# Patient Record
Sex: Male | Born: 1945 | Race: White | Hispanic: No | Marital: Married | Smoking: Former smoker
Health system: Southern US, Community
[De-identification: ages and names within clinical notes are randomized; demographics above are authoritative.]

## PROBLEM LIST (undated history)

## (undated) DIAGNOSIS — E785 Hyperlipidemia, unspecified: Secondary | ICD-10-CM

## (undated) DIAGNOSIS — G4700 Insomnia, unspecified: Secondary | ICD-10-CM

## (undated) DIAGNOSIS — F329 Major depressive disorder, single episode, unspecified: Secondary | ICD-10-CM

## (undated) DIAGNOSIS — E291 Testicular hypofunction: Secondary | ICD-10-CM

## (undated) DIAGNOSIS — M25552 Pain in left hip: Secondary | ICD-10-CM

## (undated) DIAGNOSIS — I10 Essential (primary) hypertension: Secondary | ICD-10-CM

## (undated) DIAGNOSIS — N400 Enlarged prostate without lower urinary tract symptoms: Secondary | ICD-10-CM

## (undated) DIAGNOSIS — I2510 Atherosclerotic heart disease of native coronary artery without angina pectoris: Secondary | ICD-10-CM

## (undated) DIAGNOSIS — R972 Elevated prostate specific antigen [PSA]: Secondary | ICD-10-CM

## (undated) DIAGNOSIS — F32A Depression, unspecified: Secondary | ICD-10-CM

## (undated) DIAGNOSIS — M25551 Pain in right hip: Secondary | ICD-10-CM

## (undated) DIAGNOSIS — K219 Gastro-esophageal reflux disease without esophagitis: Secondary | ICD-10-CM

## (undated) DIAGNOSIS — T7840XA Allergy, unspecified, initial encounter: Secondary | ICD-10-CM

## (undated) HISTORY — DX: Depression, unspecified: F32.A

## (undated) HISTORY — DX: Pain in left hip: M25.552

## (undated) HISTORY — DX: Atherosclerotic heart disease of native coronary artery without angina pectoris: I25.10

## (undated) HISTORY — DX: Insomnia, unspecified: G47.00

## (undated) HISTORY — DX: Allergy, unspecified, initial encounter: T78.40XA

## (undated) HISTORY — PX: HERNIA REPAIR: SHX51

## (undated) HISTORY — DX: Benign prostatic hyperplasia without lower urinary tract symptoms: N40.0

## (undated) HISTORY — DX: Hyperlipidemia, unspecified: E78.5

## (undated) HISTORY — DX: Elevated prostate specific antigen (PSA): R97.20

## (undated) HISTORY — DX: Essential (primary) hypertension: I10

## (undated) HISTORY — PX: CHOLECYSTECTOMY: SHX55

## (undated) HISTORY — DX: Pain in right hip: M25.551

## (undated) HISTORY — DX: Major depressive disorder, single episode, unspecified: F32.9

## (undated) HISTORY — DX: Testicular hypofunction: E29.1

## (undated) HISTORY — DX: Gastro-esophageal reflux disease without esophagitis: K21.9

---

## 2005-03-08 ENCOUNTER — Ambulatory Visit: Payer: Self-pay | Admitting: Cardiology

## 2005-05-10 ENCOUNTER — Ambulatory Visit: Payer: Self-pay | Admitting: Family Medicine

## 2005-05-17 ENCOUNTER — Ambulatory Visit: Payer: Self-pay | Admitting: Family Medicine

## 2005-09-05 ENCOUNTER — Ambulatory Visit: Payer: Self-pay | Admitting: Family Medicine

## 2006-03-19 ENCOUNTER — Ambulatory Visit: Payer: Self-pay | Admitting: Family Medicine

## 2006-07-01 ENCOUNTER — Ambulatory Visit: Payer: Self-pay | Admitting: Family Medicine

## 2006-10-10 ENCOUNTER — Encounter: Payer: Self-pay | Admitting: Family Medicine

## 2007-03-04 ENCOUNTER — Ambulatory Visit: Payer: Self-pay | Admitting: Family Medicine

## 2007-06-16 DIAGNOSIS — I10 Essential (primary) hypertension: Secondary | ICD-10-CM | POA: Insufficient documentation

## 2007-06-16 DIAGNOSIS — K219 Gastro-esophageal reflux disease without esophagitis: Secondary | ICD-10-CM | POA: Insufficient documentation

## 2007-06-16 DIAGNOSIS — E785 Hyperlipidemia, unspecified: Secondary | ICD-10-CM | POA: Insufficient documentation

## 2007-07-08 ENCOUNTER — Ambulatory Visit: Payer: Self-pay | Admitting: Family Medicine

## 2007-07-08 LAB — CONVERTED CEMR LAB
Nitrite: NEGATIVE
Urobilinogen, UA: NEGATIVE
WBC Urine, dipstick: NEGATIVE

## 2007-07-11 LAB — CONVERTED CEMR LAB
AST: 23 units/L (ref 0–37)
Alkaline Phosphatase: 52 units/L (ref 39–117)
BUN: 14 mg/dL (ref 6–23)
Basophils Relative: 0.2 % (ref 0.0–1.0)
CO2: 30 meq/L (ref 19–32)
Chloride: 108 meq/L (ref 96–112)
Creatinine, Ser: 0.8 mg/dL (ref 0.4–1.5)
HCT: 46.2 % (ref 39.0–52.0)
Hemoglobin: 15.7 g/dL (ref 13.0–17.0)
LDL Cholesterol: 106 mg/dL — ABNORMAL HIGH (ref 0–99)
Monocytes Absolute: 0.9 10*3/uL — ABNORMAL HIGH (ref 0.2–0.7)
Monocytes Relative: 10 % (ref 3.0–11.0)
Neutrophils Relative %: 54.4 % (ref 43.0–77.0)
Potassium: 4.6 meq/L (ref 3.5–5.1)
RBC: 5.08 M/uL (ref 4.22–5.81)
RDW: 12.9 % (ref 11.5–14.6)
TSH: 0.68 microintl units/mL (ref 0.35–5.50)
Total Bilirubin: 0.6 mg/dL (ref 0.3–1.2)
Total Protein: 6.9 g/dL (ref 6.0–8.3)
VLDL: 16 mg/dL (ref 0–40)

## 2007-07-15 ENCOUNTER — Ambulatory Visit: Payer: Self-pay | Admitting: Family Medicine

## 2007-07-15 DIAGNOSIS — J309 Allergic rhinitis, unspecified: Secondary | ICD-10-CM | POA: Insufficient documentation

## 2007-07-15 DIAGNOSIS — I2510 Atherosclerotic heart disease of native coronary artery without angina pectoris: Secondary | ICD-10-CM | POA: Insufficient documentation

## 2007-07-15 DIAGNOSIS — M069 Rheumatoid arthritis, unspecified: Secondary | ICD-10-CM | POA: Insufficient documentation

## 2007-07-15 DIAGNOSIS — Z8719 Personal history of other diseases of the digestive system: Secondary | ICD-10-CM | POA: Insufficient documentation

## 2007-07-15 DIAGNOSIS — Z87442 Personal history of urinary calculi: Secondary | ICD-10-CM | POA: Insufficient documentation

## 2008-03-02 ENCOUNTER — Ambulatory Visit: Payer: Self-pay | Admitting: Family Medicine

## 2008-03-10 ENCOUNTER — Encounter: Payer: Self-pay | Admitting: Family Medicine

## 2008-03-12 ENCOUNTER — Telehealth: Payer: Self-pay | Admitting: Family Medicine

## 2008-03-16 ENCOUNTER — Telehealth: Payer: Self-pay | Admitting: Family Medicine

## 2008-04-02 ENCOUNTER — Encounter: Payer: Self-pay | Admitting: Family Medicine

## 2008-07-07 ENCOUNTER — Ambulatory Visit: Payer: Self-pay | Admitting: Family Medicine

## 2008-07-07 LAB — CONVERTED CEMR LAB: Anti Nuclear Antibody(ANA): NEGATIVE

## 2008-07-09 LAB — CONVERTED CEMR LAB
Basophils Absolute: 0.1 10*3/uL (ref 0.0–0.1)
Eosinophils Absolute: 0.1 10*3/uL (ref 0.0–0.7)
HCT: 43.8 % (ref 39.0–52.0)
Hemoglobin: 15.6 g/dL (ref 13.0–17.0)
MCHC: 35.6 g/dL (ref 30.0–36.0)
MCV: 91.5 fL (ref 78.0–100.0)
Monocytes Absolute: 1.2 10*3/uL — ABNORMAL HIGH (ref 0.1–1.0)
Monocytes Relative: 7.8 % (ref 3.0–12.0)
Neutro Abs: 12.6 10*3/uL — ABNORMAL HIGH (ref 1.4–7.7)
Platelets: 272 10*3/uL (ref 150–400)
RDW: 12.3 % (ref 11.5–14.6)
Rhuematoid fact SerPl-aCnc: <20 intl units/mL — ABNORMAL LOW (ref 0.0–20.0)

## 2008-07-14 ENCOUNTER — Telehealth: Payer: Self-pay | Admitting: Family Medicine

## 2008-07-15 ENCOUNTER — Ambulatory Visit: Payer: Self-pay | Admitting: Family Medicine

## 2008-07-15 DIAGNOSIS — K5289 Other specified noninfective gastroenteritis and colitis: Secondary | ICD-10-CM | POA: Insufficient documentation

## 2008-08-05 ENCOUNTER — Ambulatory Visit: Payer: Self-pay | Admitting: Family Medicine

## 2008-08-05 LAB — CONVERTED CEMR LAB
Nitrite: NEGATIVE
Specific Gravity, Urine: 1.025
Urobilinogen, UA: 0.2

## 2008-08-09 LAB — CONVERTED CEMR LAB
ALT: 32 units/L (ref 0–53)
Albumin: 3.9 g/dL (ref 3.5–5.2)
Alkaline Phosphatase: 53 units/L (ref 39–117)
BUN: 15 mg/dL (ref 6–23)
Bilirubin, Direct: 0.1 mg/dL (ref 0.0–0.3)
CO2: 29 meq/L (ref 19–32)
Eosinophils Relative: 8 % — ABNORMAL HIGH (ref 0.0–5.0)
Glucose, Bld: 114 mg/dL — ABNORMAL HIGH (ref 70–99)
HCT: 44.5 % (ref 39.0–52.0)
Hemoglobin: 15.2 g/dL (ref 13.0–17.0)
LDL Cholesterol: 130 mg/dL — ABNORMAL HIGH (ref 0–99)
Lymphocytes Relative: 33.6 % (ref 12.0–46.0)
Monocytes Absolute: 0.8 10*3/uL (ref 0.1–1.0)
Monocytes Relative: 12 % (ref 3.0–12.0)
Neutro Abs: 2.9 10*3/uL (ref 1.4–7.7)
Platelets: 352 10*3/uL (ref 150–400)
Potassium: 4.5 meq/L (ref 3.5–5.1)
RDW: 13.1 % (ref 11.5–14.6)
Sodium: 143 meq/L (ref 135–145)
Total CHOL/HDL Ratio: 4.5
Total Protein: 6.8 g/dL (ref 6.0–8.3)
Triglycerides: 69 mg/dL (ref 0–149)
WBC: 6.5 10*3/uL (ref 4.5–10.5)

## 2008-08-12 ENCOUNTER — Ambulatory Visit: Payer: Self-pay | Admitting: Family Medicine

## 2008-08-12 DIAGNOSIS — N138 Other obstructive and reflux uropathy: Secondary | ICD-10-CM | POA: Insufficient documentation

## 2008-08-12 DIAGNOSIS — N401 Enlarged prostate with lower urinary tract symptoms: Secondary | ICD-10-CM

## 2008-08-31 ENCOUNTER — Ambulatory Visit: Payer: Self-pay | Admitting: Gastroenterology

## 2008-09-14 ENCOUNTER — Ambulatory Visit: Payer: Self-pay | Admitting: Gastroenterology

## 2008-09-15 ENCOUNTER — Telehealth: Payer: Self-pay | Admitting: Family Medicine

## 2008-11-05 HISTORY — PX: TRANSURETHRAL RESECTION OF PROSTATE: SHX73

## 2008-12-07 ENCOUNTER — Encounter: Payer: Self-pay | Admitting: Family Medicine

## 2009-01-18 ENCOUNTER — Ambulatory Visit: Payer: Self-pay | Admitting: Family Medicine

## 2009-01-20 ENCOUNTER — Telehealth: Payer: Self-pay | Admitting: Family Medicine

## 2009-02-08 ENCOUNTER — Ambulatory Visit: Payer: Self-pay | Admitting: Family Medicine

## 2009-03-15 ENCOUNTER — Telehealth: Payer: Self-pay | Admitting: Family Medicine

## 2009-03-30 ENCOUNTER — Telehealth (INDEPENDENT_AMBULATORY_CARE_PROVIDER_SITE_OTHER): Payer: Self-pay | Admitting: *Deleted

## 2009-03-30 ENCOUNTER — Ambulatory Visit: Payer: Self-pay | Admitting: Family Medicine

## 2009-03-30 DIAGNOSIS — L0292 Furuncle, unspecified: Secondary | ICD-10-CM | POA: Insufficient documentation

## 2009-03-30 DIAGNOSIS — L0293 Carbuncle, unspecified: Secondary | ICD-10-CM

## 2009-03-31 ENCOUNTER — Encounter: Payer: Self-pay | Admitting: Family Medicine

## 2009-05-23 ENCOUNTER — Ambulatory Visit: Payer: Self-pay | Admitting: Family Medicine

## 2009-05-23 DIAGNOSIS — R531 Weakness: Secondary | ICD-10-CM | POA: Insufficient documentation

## 2009-05-23 DIAGNOSIS — R5383 Other fatigue: Secondary | ICD-10-CM

## 2009-05-23 DIAGNOSIS — R5381 Other malaise: Secondary | ICD-10-CM

## 2009-05-31 ENCOUNTER — Encounter: Payer: Self-pay | Admitting: Family Medicine

## 2009-05-31 DIAGNOSIS — D72829 Elevated white blood cell count, unspecified: Secondary | ICD-10-CM | POA: Insufficient documentation

## 2009-06-01 ENCOUNTER — Ambulatory Visit: Payer: Self-pay | Admitting: Family Medicine

## 2009-06-01 LAB — CONVERTED CEMR LAB
Albumin: 4.7 g/dL (ref 3.5–5.2)
Alkaline Phosphatase: 60 units/L (ref 39–117)
BUN: 18 mg/dL (ref 6–23)
Basophils Absolute: 0.1 10*3/uL (ref 0.0–0.1)
Bilirubin, Direct: 0.1 mg/dL (ref 0.0–0.3)
CO2: 26 meq/L (ref 19–32)
Calcium: 11 mg/dL — ABNORMAL HIGH (ref 8.4–10.5)
Creatinine, Ser: 1.1 mg/dL (ref 0.4–1.5)
Eosinophils Absolute: 0.5 10*3/uL (ref 0.0–0.7)
GFR calc non Af Amer: 71.67 mL/min (ref >60)
Glucose, Bld: 101 mg/dL — ABNORMAL HIGH (ref 70–99)
Lymphocytes Relative: 24.5 % (ref 12.0–46.0)
MCHC: 34.2 g/dL (ref 30.0–36.0)
Monocytes Relative: 9.6 % (ref 3.0–12.0)
Neutro Abs: 8.1 10*3/uL — ABNORMAL HIGH (ref 1.4–7.7)
Neutrophils Relative %: 61.5 % (ref 43.0–77.0)
Platelets: 288 10*3/uL (ref 150.0–400.0)
RDW: 13.1 % (ref 11.5–14.6)
Total Bilirubin: 0.9 mg/dL (ref 0.3–1.2)

## 2009-06-02 ENCOUNTER — Ambulatory Visit: Payer: Self-pay | Admitting: Cardiovascular Disease

## 2009-06-02 ENCOUNTER — Encounter: Payer: Self-pay | Admitting: Family Medicine

## 2009-06-24 ENCOUNTER — Ambulatory Visit: Payer: Self-pay | Admitting: Family Medicine

## 2009-06-24 DIAGNOSIS — M25559 Pain in unspecified hip: Secondary | ICD-10-CM | POA: Insufficient documentation

## 2009-07-06 ENCOUNTER — Ambulatory Visit: Payer: Self-pay | Admitting: Family Medicine

## 2009-07-12 ENCOUNTER — Telehealth: Payer: Self-pay | Admitting: Family Medicine

## 2009-07-18 ENCOUNTER — Encounter: Payer: Self-pay | Admitting: Family Medicine

## 2009-08-09 ENCOUNTER — Ambulatory Visit: Payer: Self-pay | Admitting: Family Medicine

## 2009-08-09 LAB — CONVERTED CEMR LAB
Bilirubin Urine: NEGATIVE
Glucose, Urine, Semiquant: NEGATIVE
Ketones, urine, test strip: NEGATIVE
pH: 5.5

## 2009-08-10 LAB — CONVERTED CEMR LAB
BUN: 13 mg/dL (ref 6–23)
Basophils Relative: 1 % (ref 0.0–3.0)
Bilirubin, Direct: 0.1 mg/dL (ref 0.0–0.3)
CO2: 28 meq/L (ref 19–32)
Chloride: 106 meq/L (ref 96–112)
Cholesterol: 121 mg/dL (ref 0–200)
Creatinine, Ser: 0.7 mg/dL (ref 0.4–1.5)
Eosinophils Absolute: 0.5 10*3/uL (ref 0.0–0.7)
HCT: 46.2 % (ref 39.0–52.0)
Lymphs Abs: 2.3 10*3/uL (ref 0.7–4.0)
MCHC: 34.1 g/dL (ref 30.0–36.0)
MCV: 94.6 fL (ref 78.0–100.0)
Monocytes Absolute: 0.9 10*3/uL (ref 0.1–1.0)
Neutrophils Relative %: 53.7 % (ref 43.0–77.0)
PSA: 4.22 ng/mL — ABNORMAL HIGH (ref 0.10–4.00)
Platelets: 272 10*3/uL (ref 150.0–400.0)
TSH: 0.68 microintl units/mL (ref 0.35–5.50)
Total Bilirubin: 0.6 mg/dL (ref 0.3–1.2)
Total Protein: 6.7 g/dL (ref 6.0–8.3)
Triglycerides: 97 mg/dL (ref 0.0–149.0)

## 2009-08-16 ENCOUNTER — Ambulatory Visit: Payer: Self-pay | Admitting: Family Medicine

## 2009-09-14 ENCOUNTER — Encounter: Payer: Self-pay | Admitting: Family Medicine

## 2009-09-23 ENCOUNTER — Encounter: Payer: Self-pay | Admitting: Family Medicine

## 2009-10-10 ENCOUNTER — Ambulatory Visit: Payer: Self-pay | Admitting: Family Medicine

## 2009-10-12 ENCOUNTER — Encounter: Payer: Self-pay | Admitting: Family Medicine

## 2009-11-21 ENCOUNTER — Telehealth: Payer: Self-pay | Admitting: Family Medicine

## 2009-11-22 ENCOUNTER — Ambulatory Visit: Payer: Self-pay | Admitting: Family Medicine

## 2009-11-22 DIAGNOSIS — M25569 Pain in unspecified knee: Secondary | ICD-10-CM | POA: Insufficient documentation

## 2010-01-19 ENCOUNTER — Ambulatory Visit: Payer: Self-pay | Admitting: Family Medicine

## 2010-01-19 DIAGNOSIS — F411 Generalized anxiety disorder: Secondary | ICD-10-CM

## 2010-01-20 LAB — CONVERTED CEMR LAB
Albumin: 4.2 g/dL (ref 3.5–5.2)
BUN: 13 mg/dL (ref 6–23)
Basophils Absolute: 0.1 10*3/uL (ref 0.0–0.1)
CO2: 30 meq/L (ref 19–32)
Cholesterol: 138 mg/dL (ref 0–200)
Eosinophils Absolute: 0.5 10*3/uL (ref 0.0–0.7)
GFR calc non Af Amer: 103.28 mL/min (ref >60)
Glucose, Bld: 112 mg/dL — ABNORMAL HIGH (ref 70–99)
HCT: 48.7 % (ref 39.0–52.0)
Hemoglobin: 15.9 g/dL (ref 13.0–17.0)
Hgb A1c MFr Bld: 6.6 % — ABNORMAL HIGH (ref 4.6–6.5)
Lymphs Abs: 2.5 10*3/uL (ref 0.7–4.0)
MCHC: 32.7 g/dL (ref 30.0–36.0)
MCV: 94.4 fL (ref 78.0–100.0)
Monocytes Absolute: 0.8 10*3/uL (ref 0.1–1.0)
Neutro Abs: 3.9 10*3/uL (ref 1.4–7.7)
Platelets: 278 10*3/uL (ref 150.0–400.0)
Potassium: 4.4 meq/L (ref 3.5–5.1)
RDW: 13 % (ref 11.5–14.6)
Sodium: 141 meq/L (ref 135–145)
TSH: 0.61 microintl units/mL (ref 0.35–5.50)
Total Bilirubin: 0.8 mg/dL (ref 0.3–1.2)
VLDL: 18.8 mg/dL (ref 0.0–40.0)

## 2010-02-17 ENCOUNTER — Telehealth: Payer: Self-pay | Admitting: Family Medicine

## 2010-02-20 ENCOUNTER — Ambulatory Visit: Payer: Self-pay | Admitting: Family Medicine

## 2010-02-27 ENCOUNTER — Telehealth: Payer: Self-pay | Admitting: Family Medicine

## 2010-03-21 ENCOUNTER — Ambulatory Visit: Payer: Self-pay | Admitting: Family Medicine

## 2010-05-18 IMAGING — CT CT CHEST W/ CM
2 of 5 series · 14 of 36 positions shown, 17 images · IV contrast (agent unspecified)
Comparison: None

CT CHEST

CLINICAL DATA: Fatigue.  Night sweats.  Weakness.  Diarrhea.
Tobacco use.

CT CHEST, ABDOMEN AND PELVIS WITH CONTRAST
TECHNIQUE: Multidetector CT imaging of the chest, abdomen and
pelvis was performed following the standard protocol during bolus
administration of intravenous contrast.
Contrast: 150 ml [id number]-JHH

[Series 2: cap with · axial · 0.98mm/px · z∈[-614,-78]mm · 11 of 125 slices shown, 14 images]
[im 9/125  mediastinal]
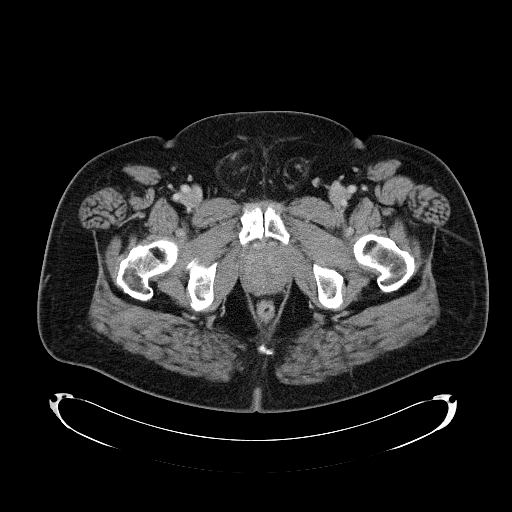
[im 9/125  lung]
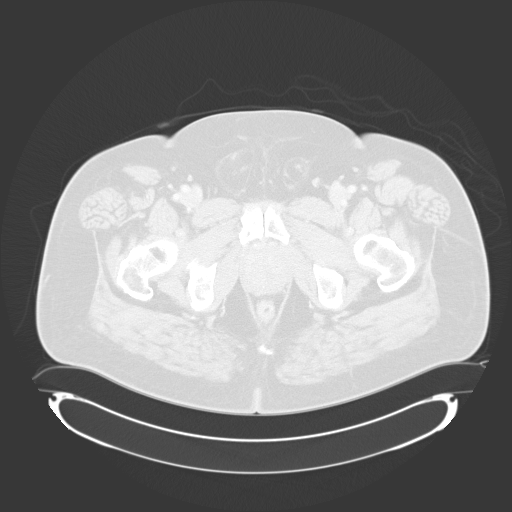
[im 18/125  lung]
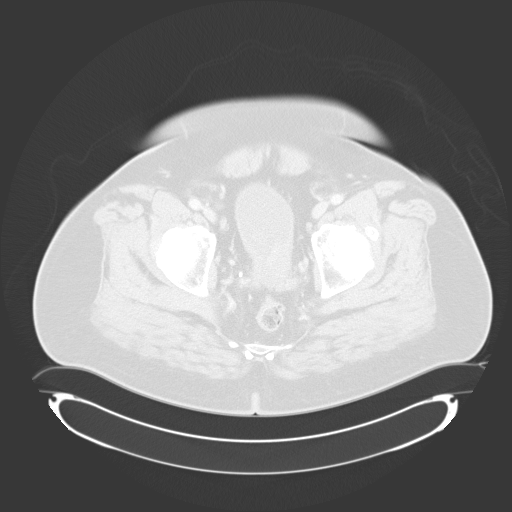
[im 27/125  lung]
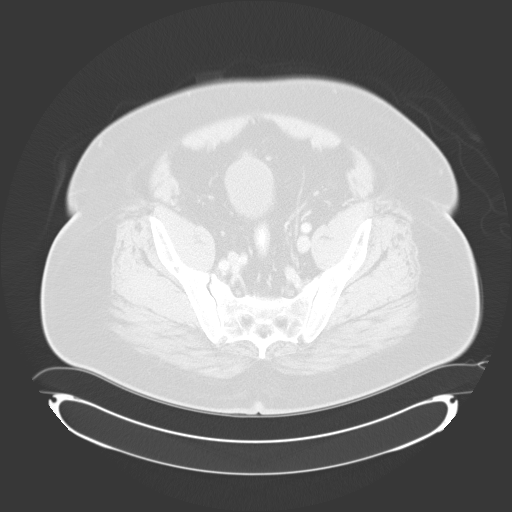
[im 45/125  lung]
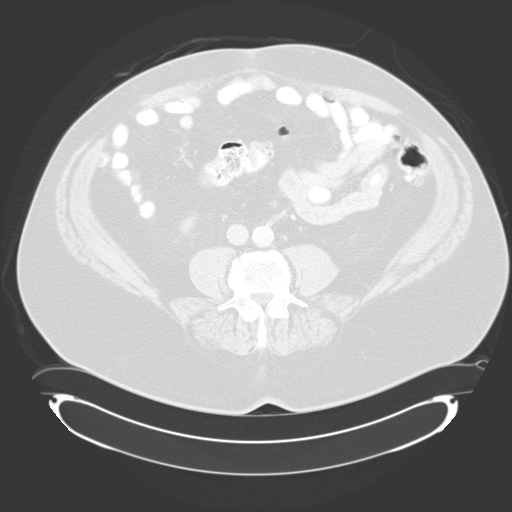
[im 54/125  mediastinal]
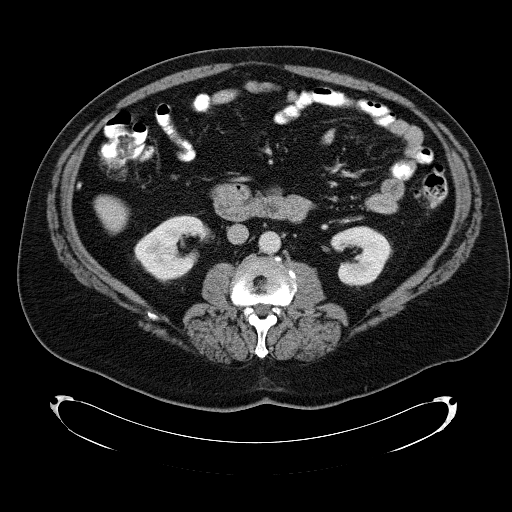
[im 54/125  lung]
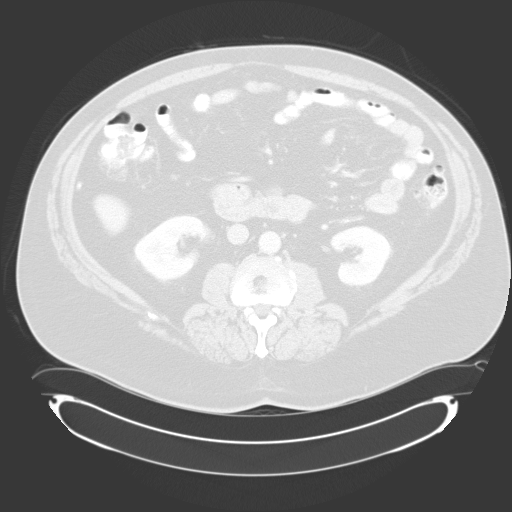
[im 63/125  lung]
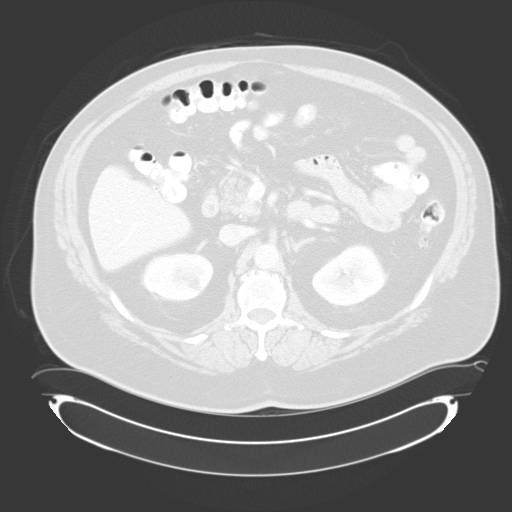
[im 71/125  lung]
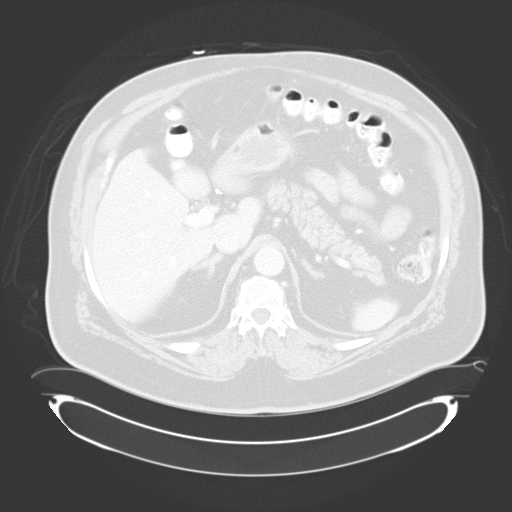
[im 80/125  lung]
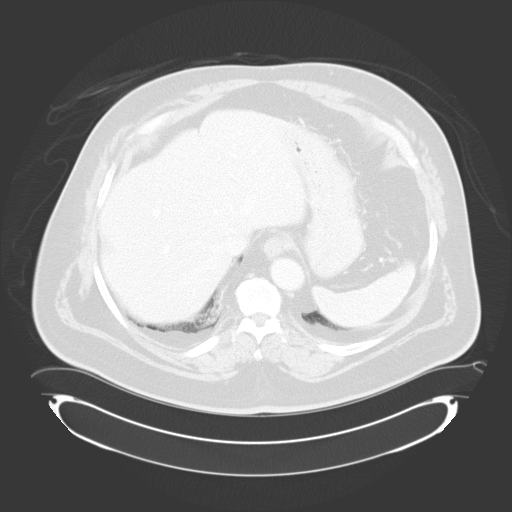
[im 98/125  mediastinal]
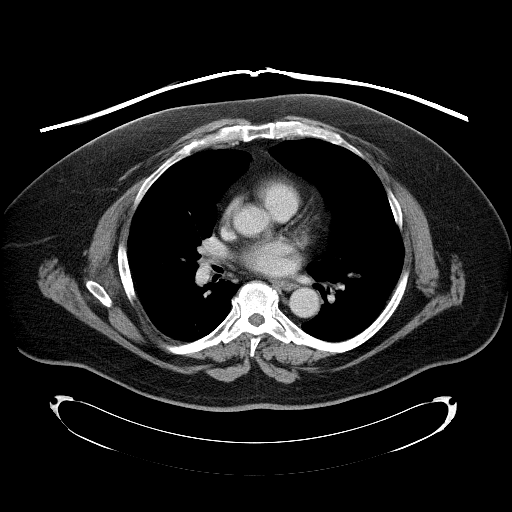
[im 98/125  lung]
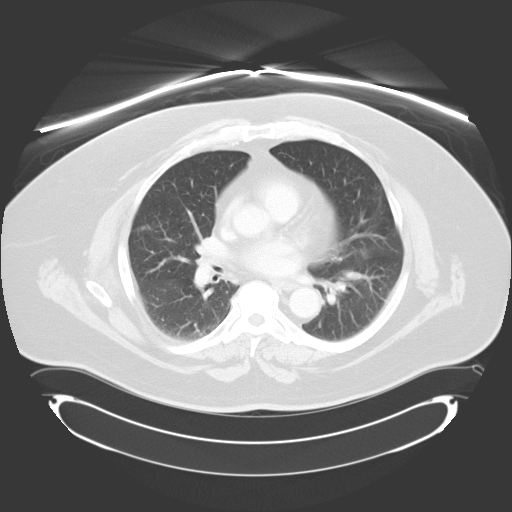
[im 107/125  lung]
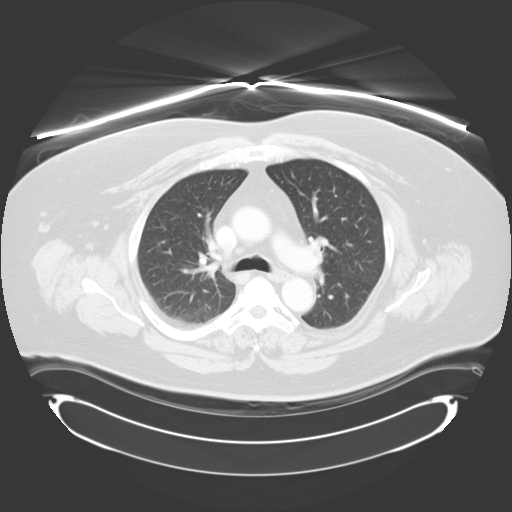
[im 116/125  lung]
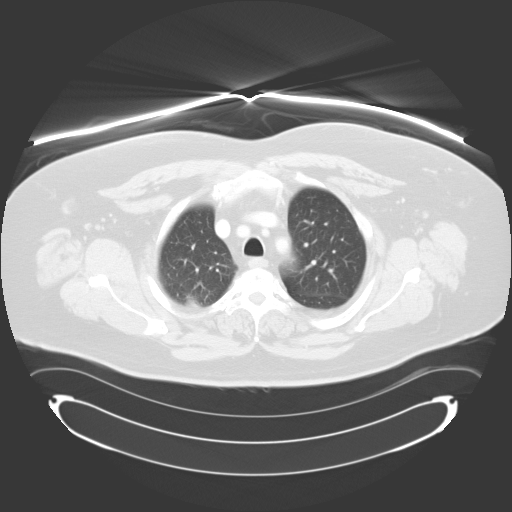

[Series 602: <mpr range> · coronal · 1.24mm/px · 3 of 170 slices shown]
[im 34/170  lung]
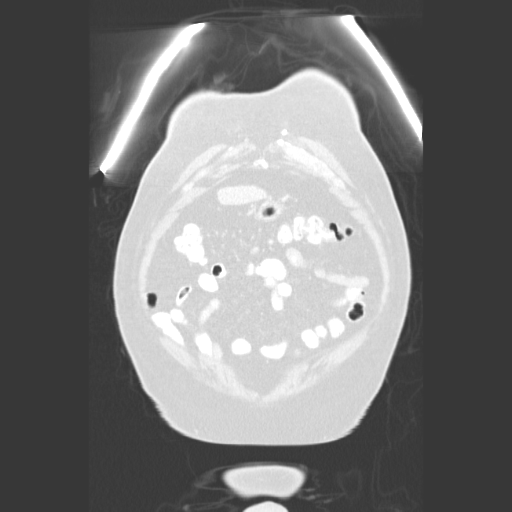
[im 68/170  lung]
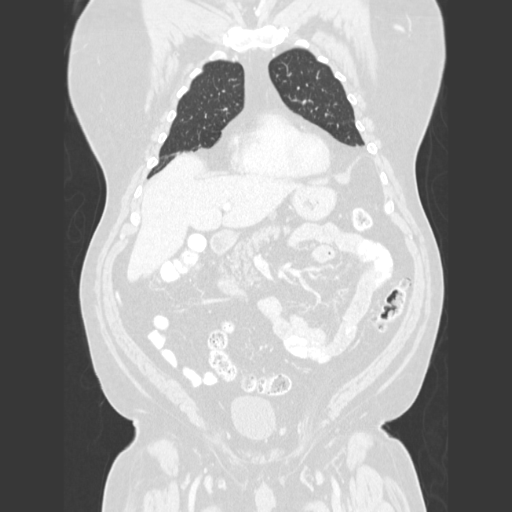
[im 102/170  lung]
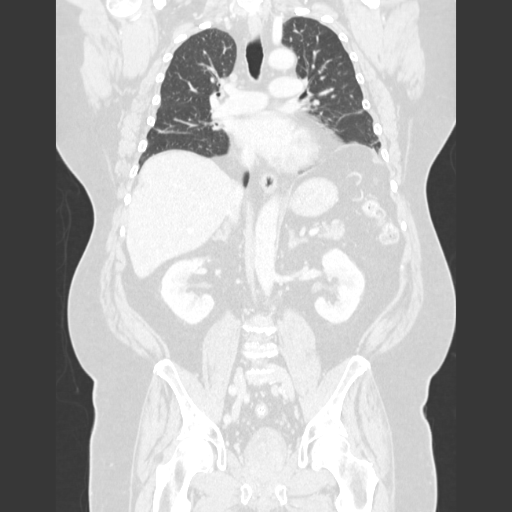

[14 of 36 positions shown; findings below may reference images not displayed]

FINDINGS: Mild prominence of supraclavicular adipose tissue on the
right could reflect a small lipoma in this vicinity, but is likely
incidental.

The thoracic inlet appears otherwise unremarkable.  Several small
mediastinal lymph nodes are present but are not pathologically
enlarged by size criteria.  No pathologic hilar adenopathy is
noted.

Heart size is within normal limits.  There is some prominence of
the epicardial adipose tissues and also of pleural adipose tissue.

There is some minimal subsegmental atelectasis in the right middle
lobe.  There is also subsegmental atelectasis or scarring in the
left lower lobe, for example on image 35 of series #4.  A 2 mm
nodule along the left major fissure on image 32 of series #4 likely
represents a subpleural lymph node.
IMPRESSION: 1.  No specific abnormalities are identified to account the
patient's symptoms.

CT ABDOMEN
FINDINGS: An 8 x 4 mm hypodense lesion in the segment to five of
the liver on image 61 of series #2 is technically nonspecific
although statistically likely to be benign.

Several hypodense lesions in the right kidney include a 1.3 x
cm upper pole hypodensity, a 6 mm in diameter mid kidney
hypodensity, and a separate 3 mm in diameter mid kidney
hypodensity.  These are highly likely to represent cysts.

The spleen, adrenal glands, and pancreas appear normal.  The
gallbladder surgically absent.

The appendix appears normal.  No dilated bowel or abnormal bowel
wall thickening is identified.

There are numerous colonic diverticula in the descending colon.

No pathologic retroperitoneal or porta hepatis adenopathy is
identified.

There is loss of intervertebral disc height as well as multilevel
vacuum disc phenomenon and intervertebral spurring.
IMPRESSION: 1.  Small hypodense right renal and right hepatic lobe lesions are
technically too small to characterize although statistically likely
to be benign.
2.  Descending colon diverticulosis.
3.  Lumbar spondylosis and degenerative disc disease.

CT PELVIS
FINDINGS: There is bridging spurring of the left sacroiliac joint.

Sigmoid diverticulosis noted without active diverticulitis
identified.

The prostate gland appears somewhat irregular and elongated
vertically, significantly indenting the bladder base.  Although
this could be due to benign hypertrophy, there is some lobularity
of the upper margin of the prostate, and also there are several
unusual lymph nodes anterior to the left side of the urinary
bladder as shown on images 102-107 of series #2.  I recommend
careful correlation with PSA level, prostate history, and physical
exam findings.

No free pelvic fluid noted.  There is a chronically fragmented
osteophyte from the left acetabulum
IMPRESSION: 1.  Prominent irregular prostate gland, with several unusual
perivesical lymph nodes.  Recommend correlation with prostate
physical exam, PSA level, urine analysis, and prostate history.
2.  Sigmoid diverticulosis without active diverticulitis
identified.

## 2010-08-18 ENCOUNTER — Encounter: Payer: Self-pay | Admitting: Family Medicine

## 2010-08-18 ENCOUNTER — Ambulatory Visit: Payer: Self-pay | Admitting: Family Medicine

## 2010-08-18 DIAGNOSIS — E291 Testicular hypofunction: Secondary | ICD-10-CM | POA: Insufficient documentation

## 2010-08-21 LAB — CONVERTED CEMR LAB
Albumin: 4.1 g/dL (ref 3.5–5.2)
Basophils Absolute: 0.1 10*3/uL (ref 0.0–0.1)
Basophils Relative: 0.6 % (ref 0.0–3.0)
CO2: 28 meq/L (ref 19–32)
Calcium: 10.5 mg/dL (ref 8.4–10.5)
Chloride: 108 meq/L (ref 96–112)
Creatinine, Ser: 0.8 mg/dL (ref 0.4–1.5)
Creatinine,U: 193.8 mg/dL
Eosinophils Absolute: 0.4 10*3/uL (ref 0.0–0.7)
Glucose, Bld: 114 mg/dL — ABNORMAL HIGH (ref 70–99)
HDL: 38.3 mg/dL — ABNORMAL LOW (ref >39.00)
Hemoglobin: 15.3 g/dL (ref 13.0–17.0)
Lymphocytes Relative: 25.4 % (ref 12.0–46.0)
MCHC: 33.5 g/dL (ref 30.0–36.0)
MCV: 94.3 fL (ref 78.0–100.0)
Microalb Creat Ratio: 7 mg/g (ref 0.0–30.0)
Microalb, Ur: 13.5 mg/dL — ABNORMAL HIGH (ref 0.0–1.9)
Monocytes Absolute: 0.9 10*3/uL (ref 0.1–1.0)
Neutro Abs: 5.5 10*3/uL (ref 1.4–7.7)
Neutrophils Relative %: 60.5 % (ref 43.0–77.0)
RBC: 4.85 M/uL (ref 4.22–5.81)
RDW: 13.5 % (ref 11.5–14.6)
Total CHOL/HDL Ratio: 4
Total Protein: 6.8 g/dL (ref 6.0–8.3)
Triglycerides: 71 mg/dL (ref 0.0–149.0)

## 2010-08-23 ENCOUNTER — Telehealth: Payer: Self-pay | Admitting: Family Medicine

## 2010-09-15 ENCOUNTER — Telehealth: Payer: Self-pay | Admitting: Family Medicine

## 2010-10-03 ENCOUNTER — Ambulatory Visit: Payer: Self-pay | Admitting: Licensed Clinical Social Worker

## 2010-10-11 ENCOUNTER — Ambulatory Visit: Payer: Self-pay | Admitting: Licensed Clinical Social Worker

## 2010-10-18 ENCOUNTER — Ambulatory Visit: Payer: Self-pay | Admitting: Licensed Clinical Social Worker

## 2010-10-25 ENCOUNTER — Ambulatory Visit: Payer: Self-pay | Admitting: Licensed Clinical Social Worker

## 2010-11-01 ENCOUNTER — Ambulatory Visit: Payer: Self-pay | Admitting: Licensed Clinical Social Worker

## 2010-11-09 ENCOUNTER — Ambulatory Visit
Admission: RE | Admit: 2010-11-09 | Discharge: 2010-11-09 | Payer: Self-pay | Source: Home / Self Care | Attending: Licensed Clinical Social Worker | Admitting: Licensed Clinical Social Worker

## 2010-11-22 ENCOUNTER — Ambulatory Visit
Admission: RE | Admit: 2010-11-22 | Discharge: 2010-11-22 | Payer: Self-pay | Source: Home / Self Care | Attending: Licensed Clinical Social Worker | Admitting: Licensed Clinical Social Worker

## 2010-12-05 NOTE — Letter (Signed)
Summary: [facility]  [facility]   Imported By: Maryln Gottron 11/10/2009 12:48:56  _____________________________________________________________________  External Attachment:    Type:   Image     Comment:   External Document

## 2010-12-05 NOTE — Assessment & Plan Note (Signed)
Summary: fu on med/njr   Vital Signs:  Patient profile:   65 year old male Weight:      266 pounds BMI:     41.81 Temp:     99.0 degrees F oral BP sitting:   130 / 82  (left arm) Cuff size:   large  Vitals Entered By: Raechel Ache, RN (February 20, 2010 9:53 AM) CC: Talk about Prozac.   History of Present Illness: Here to follow up on depression and anxiety. Several weeks ago he started back on Prozac, but he does not feel any better. Stil gets very depressed at times, and he says this is from a combination of things. First he is no longer working, and this bothers him more then he thought it would. Also he has had 6 close friends die in the last year, and this has been difficult for him. He says that 25 years ago he went through a rough period of depression around the time of his divorce. He was on Prozac then, but at a dose of 80 mg a day. He also saw a therapist then and found it helpful. He sleeps well. he denies any suicidal ideation.   Allergies: 1)  ! Codeine Phosphate (Codeine Phosphate)  Past History:  Past Medical History: Reviewed history from 11/22/2009 and no changes required. GERD Hyperlipidemia Hypertension Coronary artery disease Allergic rhinitis Depression Diverticulitis, hx of Benign prostatic hypertrophy with elevated PSA's, sees Dr. Terie Purser in [facility] insomnia bilateral hip pain and right knee  pain, sees Dr. Charlann Boxer Diabetes mellitus, type II  Review of Systems  The patient denies anorexia, fever, weight loss, weight gain, vision loss, decreased hearing, hoarseness, chest pain, syncope, dyspnea on exertion, peripheral edema, prolonged cough, headaches, hemoptysis, abdominal pain, melena, hematochezia, severe indigestion/heartburn, hematuria, incontinence, genital sores, muscle weakness, suspicious skin lesions, transient blindness, difficulty walking, unusual weight change, abnormal bleeding, enlarged lymph nodes, angioedema, breast masses, and  testicular masses.    Physical Exam  General:  Well-developed,well-nourished,in no acute distress; alert,appropriate and cooperative throughout examination Psych:  Cognition and judgment appear intact. Alert and cooperative with normal attention span and concentration. No apparent delusions, illusions, hallucinations   Impression & Recommendations:  Problem # 1:  ANXIETY STATE, UNSPECIFIED (ICD-300.00)  His updated medication list for this problem includes:    Prozac 40 Mg Caps (Fluoxetine hcl) ..... Once daily  Problem # 2:  DEPRESSION (ICD-311)  His updated medication list for this problem includes:    Prozac 40 Mg Caps (Fluoxetine hcl) ..... Once daily  Complete Medication List: 1)  Omeprazole 20 Mg Tbec (Omeprazole) .... Take 1 tablet by mouth once a day 2)  Lisinopril-hydrochlorothiazide 20-25 Mg Tabs (Lisinopril-hydrochlorothiazide) .... Take 1 tablet by mouth once a day 3)  Zolpidem Tartrate 10 Mg Tabs (Zolpidem tartrate) .Marland Kitchen.. 1 by mouth at bedtime 4)  Lipitor 80 Mg Tabs (Atorvastatin calcium) .... Once daily 5)  Etodolac 500 Mg Tabs (Etodolac) .... Two times a day 6)  Vicodin 5-500 Mg Tabs (Hydrocodone-acetaminophen) .Marland Kitchen.. 1 q 6 hours as needed pain 7)  Hydromet 5-1.5 Mg/37ml Syrp (Hydrocodone-homatropine) .Marland Kitchen.. 1 tsp q 4 hours as needed cough 8)  Cialis 20 Mg Tabs (Tadalafil) .Marland Kitchen.. 1 tablet every other day as needed for erectile dysfunction 9)  Prozac 40 Mg Caps (Fluoxetine hcl) .... Once daily  Patient Instructions: 1)  We discussed this for 45 minutes. We will increase the Prozac to 40 mg a day, though I suspect he may need 80 mg to  be effective. We agreed that psychotherapy would be a great idea for him, and he will see who is approved with his insurance plan. I will see him back in 2 weeks.  Prescriptions: PROZAC 40 MG CAPS (FLUOXETINE HCL) once daily  #30 x 5   Entered and Authorized by:   Nelwyn Salisbury MD   Signed by:   Nelwyn Salisbury MD on 02/20/2010   Method used:    [facility]  [place] [address]ail[facility]       [address]       [place], [place]  [address]       Ph: [phone] or [phone]       Fax: [phone]   RxID:   [id number]

## 2010-12-05 NOTE — Assessment & Plan Note (Signed)
Summary: F/U ON BRONCHITIS / R KNEE PAIN // RS   Vital Signs:  Patient profile:   65 year old male Weight:      274 pounds BMI:     43.07 Temp:     98.2 degrees F oral Pulse rate:   84 / minute BP sitting:   116 / 88  (left arm) Cuff size:   large  Vitals Entered By: Alfred Levins, CMA (November 22, 2009 11:54 AM) CC: f/u on rt knee and bronchitis   History of Present Illness: Here to discuss several issues. First he is proud to say that he is still off tobacco, and he feels better with more energy. However he has still had some dry occasioanl coughing since we treated his bronchitis last month. Of note he had a clear chest CT last July. Also he asks my advice about his right knee. He has seen Dr. 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 Dr. Charlann Boxer offered him arthroscopy, but [name] is not very eager to do this. Now that he is not working, he is tolerating the knee pain fairly well. he would like to delay the surgery as much as possible. he is getting long term disability payments for this, and he hopes to get Social Security when he turns 66.   Preventive Screening-Counseling & Management  Alcohol-Tobacco     Smoking Status: quit  Current Medications (verified): 1)  Omeprazole 20 Mg  Tbec (Omeprazole) .... Take 1 Tablet By Mouth Once A Day 2)  Lisinopril-Hydrochlorothiazide 20-25 Mg  Tabs (Lisinopril-Hydrochlorothiazide) .... Take 1 Tablet By Mouth Once A Day 3)  Zolpidem Tartrate 10 Mg Tabs (Zolpidem Tartrate) .Marland Kitchen.. 1 By Mouth At Bedtime 4)  Lipitor 80 Mg Tabs (Atorvastatin Calcium) .... Once Daily 5)  Etodolac 500 Mg Tabs (Etodolac) .... Two Times A Day 6)  Vicodin 5-500 Mg Tabs (Hydrocodone-Acetaminophen) .Marland Kitchen.. 1 Q 6 Hours As Needed Pain 7)  Hydromet 5-1.5 Mg/29ml Syrp (Hydrocodone-Homatropine) .Marland Kitchen.. 1 Tsp Q 4 Hours As Needed Cough  Allergies (verified): 1)  ! Codeine Phosphate (Codeine Phosphate)  Past History:  Past  Medical History: GERD Hyperlipidemia Hypertension Coronary artery disease Allergic rhinitis Depression Diverticulitis, hx of Benign prostatic hypertrophy with elevated PSA's, sees Dr. Terie Purser in [place] insomnia bilateral hip pain and right knee  pain, sees Dr. Charlann Boxer Diabetes mellitus, type II  Past Surgical History: Reviewed history from 08/16/2009 and no changes required. Cholecystectomy Umbilical hernia repair Transurethral resection of the prostate twice, last in [place]  in 1-10 per Dr. Wilson Singer colonoscopy 09-14-08 per Dr. 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[place], now does apartment maintenance Divorced Alcohol use-no Former Smoker 7wks Smoking Status:  quit  Review of Systems  The patient denies anorexia, fever, weight loss, weight gain, vision loss, decreased hearing, hoarseness, chest pain, syncope, dyspnea on exertion, peripheral edema, prolonged cough, headaches, hemoptysis, abdominal pain, melena, hematochezia, severe indigestion/heartburn, hematuria, incontinence, genital sores, muscle weakness, suspicious skin lesions, transient blindness, difficulty walking, depression, unusual weight change, abnormal bleeding, enlarged lymph nodes, angioedema, breast masses, and testicular masses.    Physical Exam  General:  Well-developed,well-nourished,in no acute distress; alert,appropriate and cooperative throughout examination Lungs:  Normal respiratory effort, chest expands symmetrically. Lungs are clear to auscultation, no crackles or wheezes. Heart:  Normal rate and regular rhythm. S1 and S2 normal without gallop, murmur, click, rub or other extra sounds.   Impression &  Recommendations:  Problem # 1:  COUGH (ICD-786.2)  Problem # 2:  KNEE PAIN (ICD-719.46)  His updated medication list for this problem includes:    Etodolac 500 Mg Tabs (Etodolac) .Marland Kitchen..Marland Kitchen Two times a day    Vicodin 5-500 Mg Tabs (Hydrocodone-acetaminophen)  .Marland Kitchen... 1 q 6 hours as needed pain  Complete Medication List: 1)  Omeprazole 20 Mg Tbec (Omeprazole) .... Take 1 tablet by mouth once a day 2)  Lisinopril-hydrochlorothiazide 20-25 Mg Tabs (Lisinopril-hydrochlorothiazide) .... Take 1 tablet by mouth once a day 3)  Zolpidem Tartrate 10 Mg Tabs (Zolpidem tartrate) .Marland Kitchen.. 1 by mouth at bedtime 4)  Lipitor 80 Mg Tabs (Atorvastatin calcium) .... Once daily 5)  Etodolac 500 Mg Tabs (Etodolac) .... Two times a day 6)  Vicodin 5-500 Mg Tabs (Hydrocodone-acetaminophen) .Marland Kitchen.. 1 q 6 hours as needed pain 7)  Hydromet 5-1.5 Mg/13ml Syrp (Hydrocodone-homatropine) .Marland Kitchen.. 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 As his lungs recover from smoking this should get better and better over the next 6 months or so. We will follow up as needed . As for the knee pain, since his is not working  on it, he seems to toerate the pain well. I agreed with him to postpone this surgery as long as possible.

## 2010-12-05 NOTE — Letter (Signed)
Summary: Physician's Report/[employer]  Physician's Report/[employer]   Imported By: Maryln Gottron 01/20/2010 14:38:42  _____________________________________________________________________  External Attachment:    Type:   Image     Comment:   External Document

## 2010-12-05 NOTE — Letter (Signed)
Summary: [facility]  [facility]   Imported By: Maryln Gottron 11/10/2009 12:30:46  _____________________________________________________________________  External Attachment:    Type:   Image     Comment:   External Document

## 2010-12-05 NOTE — Progress Notes (Signed)
Summary: refill vicodin  Phone Note Refill Request Message from:  Fax from Pharmacy on September 15, 2010 1:59 PM  Refills Requested: Medication #1:  VICODIN 5-500 MG TABS 1 q 6 hours as needed pain   Last Refilled: 08/23/2010 [facility]  [phone]   Method Requested: Fax to Local Pharmacy Initial call taken by: Duard Brady LPN,  September 15, 2010 2:00 PM  Follow-up for Phone Call        call in #60 with 5 rf Follow-up by: Nelwyn Salisbury MD,  September 15, 2010 2:40 PM  Additional Follow-up for Phone Call Additional follow up Details #1::        done Additional Follow-up by: Pura Spice, RN,  September 15, 2010 2:47 PM    New/Updated Medications: VICODIN 5-500 MG TABS (HYDROCODONE-ACETAMINOPHEN) 1 q 6 hours as needed pain Prescriptions: VICODIN 5-500 MG TABS (HYDROCODONE-ACETAMINOPHEN) 1 q 6 hours as needed pain  #609 x 5   Entered by:   Pura Spice, RN   Authorized by:   Nelwyn Salisbury MD   Signed by:   Pura Spice, RN on 09/15/2010   Method used:   Telephoned to ...       [facility]  [place] [address] (retail)       [address]       [place], [place]  [address]       Ph: [phone] or [phone]       Fax: [phone]   RxID:   [id number]   Appended Document: refill vicodin above rx says # 609 -- typo error only 60 was telephoned in.gh rn

## 2010-12-05 NOTE — Assessment & Plan Note (Signed)
Summary: R LEG TINGLING / PAIN // RS   Vital Signs:  Patient profile:   65 year old male Weight:      269 pounds BMI:     42.28 BP sitting:   120 / 82  (left arm) Cuff size:   large  Vitals Entered By: Raechel Ache, RN (January 19, 2010 9:39 AM) CC: C/o numbness & tingling R thigh.   History of Present Illness: here to ask about some numbness down the lateral side of his tight leg from the thigh down to the foot that has been present for 15 years. Nothng ever changes. No pain or weakness. thus started when the herniated disc in his back started acting up. the back pain improved but this numbness never did. Also he is fasting for some blood work. His glucoses have been up a little at home. Also he brings some more disability papers for me to fill out today. lastly he has had more anxiety lately for no particular reason, and he wants to get back on prozac.   Allergies: 1)  ! Codeine Phosphate (Codeine Phosphate)  Past History:  Past Medical History: Reviewed history from 11/22/2009 and no changes required. GERD Hyperlipidemia Hypertension Coronary artery disease Allergic rhinitis Depression Diverticulitis, hx of Benign prostatic hypertrophy with elevated PSA's, sees Dr. Terie Purser in [facility] insomnia bilateral hip pain and right knee  pain, sees Dr. Charlann Boxer Diabetes mellitus, type II  Review of Systems  The patient denies anorexia, fever, weight loss, weight gain, vision loss, decreased hearing, hoarseness, chest pain, syncope, dyspnea on exertion, peripheral edema, prolonged cough, headaches, hemoptysis, abdominal pain, melena, hematochezia, severe indigestion/heartburn, hematuria, incontinence, genital sores, muscle weakness, suspicious skin lesions, transient blindness, difficulty walking, depression, unusual weight change, abnormal bleeding, enlarged lymph nodes, angioedema, breast masses, and testicular masses.    Physical Exam  General:  overweight-appearing.     Neurologic:  No cranial nerve deficits noted. Station and gait are normal. Plantar reflexes are down-going bilaterally. DTRs are symmetrical throughout. Sensory, motor and coordinative functions appear intact. Psych:  Cognition and judgment appear intact. Alert and cooperative with normal attention span and concentration. No apparent delusions, illusions, hallucinations   Impression & Recommendations:  Problem # 1:  HIP PAIN, BILATERAL (ICD-719.45) Assessment Unchanged  His updated medication list for this problem includes:    Etodolac 500 Mg Tabs (Etodolac) .Marland Kitchen..Marland Kitchen Two times a day    Vicodin 5-500 Mg Tabs (Hydrocodone-acetaminophen) .Marland Kitchen... 1 q 6 hours as needed pain  Problem # 2:  DIABETES MELLITUS, TYPE II (ICD-250.00) Assessment: Deteriorated  His updated medication list for this problem includes:    Lisinopril-hydrochlorothiazide 20-25 Mg Tabs (Lisinopril-hydrochlorothiazide) .Marland Kitchen... Take 1 tablet by mouth once a day  Orders: Venipuncture ([address]) TLB-Lipid Panel (80061-LIPID) TLB-BMP (Basic Metabolic Panel-BMET) (80048-METABOL) TLB-CBC Platelet - w/Differential (85025-CBCD) TLB-Hepatic/Liver Function Pnl (80076-HEPATIC) TLB-TSH (Thyroid Stimulating Hormone) (84443-TSH) TLB-A1C / Hgb A1C (Glycohemoglobin) (83036-A1C)  Problem # 3:  ANXIETY STATE, UNSPECIFIED (ICD-300.00) Assessment: Deteriorated  His updated medication list for this problem includes:    Prozac 20 Mg Caps (Fluoxetine hcl) ..... Once daily  Complete Medication List: 1)  Omeprazole 20 Mg Tbec (Omeprazole) .... Take 1 tablet by mouth once a day 2)  Lisinopril-hydrochlorothiazide 20-25 Mg Tabs (Lisinopril-hydrochlorothiazide) .... Take 1 tablet by mouth once a day 3)  Zolpidem Tartrate 10 Mg Tabs (Zolpidem tartrate) .Marland Kitchen.. 1 by mouth at bedtime 4)  Lipitor 80 Mg Tabs (Atorvastatin calcium) .... Once daily 5)  Etodolac 500 Mg Tabs (Etodolac) .Marland KitchenMarland KitchenMarland Kitchen  Two times a day 6)  Vicodin 5-500 Mg Tabs (Hydrocodone-acetaminophen)  .Marland Kitchen.. 1 q 6 hours as needed pain 7)  Hydromet 5-1.5 Mg/67ml Syrp (Hydrocodone-homatropine) .Marland Kitchen.. 1 tsp q 4 hours as needed cough 8)  Cialis 20 Mg Tabs (Tadalafil) .Marland Kitchen.. 1 tablet every other day as needed for erectile dysfunction 9)  Prozac 20 Mg Caps (Fluoxetine hcl) .... Once daily  Patient Instructions: 1)  Get back on Prozac. Forms were filled out. Check labs today Prescriptions: PROZAC 20 MG CAPS (FLUOXETINE HCL) once daily  #30 x 11   Entered and Authorized by:   Nelwyn Salisbury MD   Signed by:   Nelwyn Salisbury MD on 01/19/2010   Method used:   [facility]        [facility]  [place] [address]       [address]       [place], [place]  [address]       Ph: [phone] or [phone]       Fax: [phone]   RxID:   [id number]

## 2010-12-05 NOTE — Letter (Signed)
Summary: [facility]  [facility]   Imported By: Maryln Gottron 11/10/2009 12:29:13  _____________________________________________________________________  External Attachment:    Type:   Image     Comment:   External Document

## 2010-12-05 NOTE — Assessment & Plan Note (Signed)
Summary: fu on meds/njr   Vital Signs:  Patient profile:   65 year old male Weight:      260 pounds BMI:     40.87 BP sitting:   120 / 84  (left arm) Cuff size:   large  Vitals Entered By: Raechel Ache, RN (Mar 21, 2010 10:41 AM) CC: Talk about Prozac- now taking 60mg /day.   History of Present Illness: Here to follow up on Prozac. This has been helpful, but he thinks he may need another dosage increase. he has upped this on his own to 60mg  a day, and he feels better but not quite to where he wants to be. he is happier and more relaxed. No side effects to report.   Allergies: 1)  ! Codeine Phosphate (Codeine Phosphate)  Past History:  Past Medical History: Reviewed history from 11/22/2009 and no changes required. GERD Hyperlipidemia Hypertension Coronary artery disease Allergic rhinitis Depression Diverticulitis, hx of Benign prostatic hypertrophy with elevated PSA's, sees Dr. Terie Purser in [facility] insomnia bilateral hip pain and right knee  pain, sees Dr. Charlann Boxer Diabetes mellitus, type II  Review of Systems  The patient denies anorexia, fever, weight loss, weight gain, vision loss, decreased hearing, hoarseness, chest pain, syncope, dyspnea on exertion, peripheral edema, prolonged cough, headaches, hemoptysis, abdominal pain, melena, hematochezia, severe indigestion/heartburn, hematuria, incontinence, genital sores, muscle weakness, suspicious skin lesions, transient blindness, difficulty walking, unusual weight change, abnormal bleeding, enlarged lymph nodes, angioedema, breast masses, and testicular masses.    Physical Exam  General:  Well-developed,well-nourished,in no acute distress; alert,appropriate and cooperative throughout examination Psych:  Cognition and judgment appear intact. Alert and cooperative with normal attention span and concentration. No apparent delusions, illusions, hallucinations   Impression & Recommendations:  Problem # 1:  ANXIETY STATE,  UNSPECIFIED (ICD-300.00)  His updated medication list for this problem includes:    Prozac 40 Mg Caps (Fluoxetine hcl) .Marland Kitchen... 2 capsules once daily  Problem # 2:  DEPRESSION (ICD-311)  His updated medication list for this problem includes:    Prozac 40 Mg Caps (Fluoxetine hcl) .Marland Kitchen... 2 capsules once daily  Complete Medication List: 1)  Omeprazole 20 Mg Tbec (Omeprazole) .... Take 1 tablet by mouth once a day 2)  Lisinopril-hydrochlorothiazide 20-25 Mg Tabs (Lisinopril-hydrochlorothiazide) .... Take 1 tablet by mouth once a day 3)  Zolpidem Tartrate 10 Mg Tabs (Zolpidem tartrate) .Marland Kitchen.. 1 by mouth at bedtime 4)  Lipitor 80 Mg Tabs (Atorvastatin calcium) .... Once daily 5)  Etodolac 500 Mg Tabs (Etodolac) .... Two times a day 6)  Vicodin 5-500 Mg Tabs (Hydrocodone-acetaminophen) .Marland Kitchen.. 1 q 6 hours as needed pain 7)  Hydromet 5-1.5 Mg/47ml Syrp (Hydrocodone-homatropine) .Marland Kitchen.. 1 tsp q 4 hours as needed cough 8)  Cialis 20 Mg Tabs (Tadalafil) .Marland Kitchen.. 1 tablet every other day as needed for erectile dysfunction 9)  Prozac 40 Mg Caps (Fluoxetine hcl) .... 2 capsules once daily 10)  Androgel Pump 1 % Gel (Testosterone) .... Apply 10 grams daily  Patient Instructions: 1)  We spent 30 minutes discussing this. We will increase the dose to 80 mg a day. He will call us with a report in 2 weeks Prescriptions: ANDROGEL PUMP 1 % GEL (TESTOSTERONE) apply 10 grams daily  #30 x 11   Entered and Authorized by:   Nelwyn Salisbury MD   Signed by:   Nelwyn Salisbury MD on 03/21/2010   Method used:   Print then Give to Patient   RxID:   [id number] PROZAC 40  MG CAPS (FLUOXETINE HCL) 2 capsules once daily  #60 x 11   Entered and Authorized by:   Nelwyn Salisbury MD   Signed by:   Nelwyn Salisbury MD on 03/21/2010   Method used:   Print then Give to Patient   RxID:   [id number]

## 2010-12-05 NOTE — Letter (Signed)
Summary: [facility]  [facility]   Imported By: Maryln Gottron 11/10/2009 12:53:17  _____________________________________________________________________  External Attachment:    Type:   Image     Comment:   External Document

## 2010-12-05 NOTE — Progress Notes (Signed)
Summary: Appt wanted  Phone Note Call from Patient Call back at Home Phone [phone]   Summary of Call: Patient called today b/c he would like a f/u for bronchitis and  to discuss his knee. Please advise.  Initial call taken by: Lucious Groves,  November 21, 2009 1:46 PM  Follow-up for Phone Call        set up for tomorrow  Follow-up by: Nelwyn Salisbury MD,  November 21, 2009 2:16 PM  Additional Follow-up for Phone Call Additional follow up Details #1::        Phone Call Completed------ Contacted pt.... appt was scheduled for tomorrow, 11/22/2009 @ 11:30am with Dr Clent Ridges.  Additional Follow-up by: Debbra Riding,  November 21, 2009 2:58 PM

## 2010-12-05 NOTE — Letter (Signed)
Summary: [facility]  [facility]   Imported By: Maryln Gottron 11/10/2009 12:50:52  _____________________________________________________________________  External Attachment:    Type:   Image     Comment:   External Document

## 2010-12-05 NOTE — Progress Notes (Signed)
Summary: Phone call completed   Phone Note Call from Patient Call back at Home Phone [phone]   Caller: vm Summary of Call: Wants copy of labs & physical results sent to him for his files.  ? [address] [place], [place].   Initial call taken by: Rudy Jew, RN,  August 23, 2010 2:07 PM  Follow-up for Phone Call        please do so  Follow-up by: Nelwyn Salisbury MD,  August 24, 2010 10:20 AM  Additional Follow-up for Phone Call Additional follow up Details #1::        Phone Call Completed Additional Follow-up by: Drue Stager,  August 25, 2010 8:10 AM

## 2010-12-05 NOTE — Progress Notes (Signed)
Summary: refill Vicodin  Phone Note Refill Request Message from:  Fax from Pharmacy on February 27, 2010 1:23 PM  Refills Requested: Medication #1:  VICODIN 5-500 MG TABS 1 q 6 hours as needed pain   Dosage confirmed as above?Dosage Confirmed   Supply Requested: 1 month   Last Refilled: 11/20/2009   Notes: #60  Method Requested: Telephone to Pharmacy Initial call taken by: Raechel Ache, RN,  February 27, 2010 1:23 PM [facility]  [place] [phone][facility]  Follow-up for Phone Call        call in #60 with 5 rf Follow-up by: Nelwyn Salisbury MD,  February 28, 2010 8:53 AM  Additional Follow-up for Phone Call Additional follow up Details #1::        Rx faxed to pharmacy Additional Follow-up by: Raechel Ache, RN,  February 28, 2010 10:58 AM    Prescriptions: VICODIN 5-500 MG TABS (HYDROCODONE-ACETAMINOPHEN) 1 q 6 hours as needed pain  #60 x 5   Entered by:   Raechel Ache, RN   Authorized by:   Nelwyn Salisbury MD   Signed by:   Raechel Ache, RN on 02/28/2010   Method used:   Historical   RxID:   [id number]

## 2010-12-05 NOTE — Progress Notes (Signed)
Summary: refill zolpidem  Phone Note Refill Request Message from:  Fax from Pharmacy on February 17, 2010 10:51 AM  Refills Requested: Medication #1:  ZOLPIDEM TARTRATE 10 MG TABS 1 by mouth at bedtime   Dosage confirmed as above?Dosage Confirmed   Supply Requested: 1 month   Last Refilled: 01/22/2010  Method Requested: Telephone to Pharmacy Initial call taken by: Raechel Ache, RN,  February 17, 2010 10:52 AM [facility]  [place] [phone][facility]  Follow-up for Phone Call        call in #30 with 5 rf Follow-up by: Nelwyn Salisbury MD,  February 17, 2010 11:59 AM  Additional Follow-up for Phone Call Additional follow up Details #1::        Rx faxed to pharmacy Additional Follow-up by: Raechel Ache, RN,  February 17, 2010 12:30 PM    Prescriptions: ZOLPIDEM TARTRATE 10 MG TABS (ZOLPIDEM TARTRATE) 1 by mouth at bedtime  #30 x 5   Entered by:   Raechel Ache, RN   Authorized by:   Nelwyn Salisbury MD   Signed by:   Raechel Ache, RN on 02/17/2010   Method used:   Historical   RxID:   [id number]

## 2010-12-05 NOTE — Assessment & Plan Note (Signed)
Summary: CPX (PT WILL COME IN FASTING) // RS   Vital Signs:  Patient profile:   65 year old male Height:      67 inches Weight:      272 pounds O2 Sat:      95 % Temp:     98.4 degrees F Pulse rate:   77 / minute BP sitting:   120 / 80  (left arm) Cuff size:   large  Vitals Entered By: Pura Spice, RN (August 18, 2010 8:56 AM) CC: cpx  Is Patient Diabetic? No   Contraindications/Deferment of Procedures/Staging:    Test/Procedure: FLU VAX    Reason for deferment: patient declined     Test/Procedure: Pneumovax vaccine    Reason for deferment: patient declined   History of Present Illness: 65 yr old male for a cpx. He feels well and has no concerns. He saw his Urologist recently for an exam and a PSA.   Allergies: 1)  ! Codeine Phosphate (Codeine Phosphate)  Past History:  Past Medical History: GERD Hyperlipidemia Hypertension Coronary artery disease Allergic rhinitis Depression Diverticulitis, hx of Benign prostatic hypertrophy with elevated PSA's, sees Dr. Terie Purser in [place] insomnia bilateral hip pain and right knee  pain, sees Dr. Charlann Boxer Diabetes mellitus, type II hypogonadism  Past Surgical History: Reviewed history from 08/16/2009 and no changes required. Cholecystectomy Umbilical hernia repair Transurethral resection of the prostate twice, last in [place]  in 1-10 per Dr. Wilson Singer colonoscopy 09-14-08 per Dr. Arlyce Dice, repeat in 10 yrs  Family History: Reviewed history from 07/15/2007 and no changes required. Family History Depression  Social History: Reviewed history from 11/22/2009 and no changes required. 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[employer], now retired  Divorced Alcohol use-no Former Smoker 7wks  Review of Systems  The patient denies anorexia, fever, weight loss, weight gain, vision loss, decreased hearing, hoarseness, chest pain, syncope, dyspnea on exertion, peripheral edema, prolonged cough, headaches, hemoptysis, abdominal pain,  melena, hematochezia, severe indigestion/heartburn, hematuria, incontinence, genital sores, muscle weakness, suspicious skin lesions, transient blindness, difficulty walking, depression, unusual weight change, abnormal bleeding, enlarged lymph nodes, angioedema, breast masses, and testicular masses.    Physical Exam  General:  overweight-appearing.   Head:  Normocephalic and atraumatic without obvious abnormalities. No apparent alopecia or balding. Eyes:  No corneal or conjunctival inflammation noted. EOMI. Perrla. Funduscopic exam benign, without hemorrhages, exudates or papilledema. Vision grossly normal. Ears:  External ear exam shows no significant lesions or deformities.  Otoscopic examination reveals clear canals, tympanic membranes are intact bilaterally without bulging, retraction, inflammation or discharge. Hearing is grossly normal bilaterally. Nose:  External nasal examination shows no deformity or inflammation. Nasal mucosa are pink and moist without lesions or exudates. Mouth:  Oral mucosa and oropharynx without lesions or exudates.  Teeth in good repair. Neck:  No deformities, masses, or tenderness noted. Chest Wall:  No deformities, masses, tenderness or gynecomastia noted. Lungs:  Normal respiratory effort, chest expands symmetrically. Lungs are clear to auscultation, no crackles or wheezes. Heart:  Normal rate and regular rhythm. S1 and S2 normal without gallop, murmur, click, rub or other extra sounds. EKG normal Abdomen:  Bowel sounds positive,abdomen soft and non-tender without masses, organomegaly or hernias noted. Msk:  No deformity or scoliosis noted of thoracic or lumbar spine.   Pulses:  R and L carotid,radial,femoral,dorsalis pedis and posterior tibial pulses are full and equal bilaterally Extremities:  No clubbing, cyanosis, edema, or deformity noted with normal full range of motion of all joints.  Neurologic:  No cranial nerve deficits noted. Station and gait are  normal. Plantar reflexes are down-going bilaterally. DTRs are symmetrical throughout. Sensory, motor and coordinative functions appear intact. Skin:  Intact without suspicious lesions or rashes Cervical Nodes:  No lymphadenopathy noted Axillary Nodes:  No palpable lymphadenopathy Inguinal Nodes:  No significant adenopathy Psych:  Cognition and judgment appear intact. Alert and cooperative with normal attention span and concentration. No apparent delusions, illusions, hallucinations   Impression & Recommendations:  Problem # 1:  HYPOGONADISM (ICD-257.2)  Orders: TLB-Testosterone, Total (84403-TESTO)  Problem # 2:  ANXIETY STATE, UNSPECIFIED (ICD-300.00)  His updated medication list for this problem includes:    Prozac 40 Mg Caps (Fluoxetine hcl) .Marland Kitchen... 2 capsules once daily  Problem # 3:  DIABETES MELLITUS, TYPE II (ICD-250.00)  His updated medication list for this problem includes:    Lisinopril-hydrochlorothiazide 20-25 Mg Tabs (Lisinopril-hydrochlorothiazide) .Marland Kitchen... Take 1 tablet by mouth once a day  Orders: UA Dipstick w/o Micro (automated)  (81003) Venipuncture (04540) TLB-Lipid Panel (80061-LIPID) TLB-BMP (Basic Metabolic Panel-BMET) (80048-METABOL) TLB-CBC Platelet - w/Differential (85025-CBCD) TLB-Hepatic/Liver Function Pnl (80076-HEPATIC) TLB-TSH (Thyroid Stimulating Hormone) (84443-TSH) TLB-Microalbumin/Creat Ratio, Urine (82043-MALB) TLB-A1C / Hgb A1C (Glycohemoglobin) (83036-A1C)  Problem # 4:  CORONARY ARTERY DISEASE (ICD-414.00)  His updated medication list for this problem includes:    Lisinopril-hydrochlorothiazide 20-25 Mg Tabs (Lisinopril-hydrochlorothiazide) .Marland Kitchen... Take 1 tablet by mouth once a day  Problem # 5:  HYPERTENSION (ICD-401.9)  His updated medication list for this problem includes:    Lisinopril-hydrochlorothiazide 20-25 Mg Tabs (Lisinopril-hydrochlorothiazide) .Marland Kitchen... Take 1 tablet by mouth once a day  Problem # 6:  HYPERLIPIDEMIA  (ICD-272.4)  His updated medication list for this problem includes:    Lipitor 80 Mg Tabs (Atorvastatin calcium) ..... Once daily  Complete Medication List: 1)  Omeprazole 20 Mg Tbec (Omeprazole) .... Take 1 tablet by mouth once a day 2)  Lisinopril-hydrochlorothiazide 20-25 Mg Tabs (Lisinopril-hydrochlorothiazide) .... Take 1 tablet by mouth once a day 3)  Zolpidem Tartrate 10 Mg Tabs (Zolpidem tartrate) .Marland Kitchen.. 1 by mouth at bedtime 4)  Lipitor 80 Mg Tabs (Atorvastatin calcium) .... Once daily 5)  Etodolac 500 Mg Tabs (Etodolac) .... Two times a day 6)  Vicodin 5-500 Mg Tabs (Hydrocodone-acetaminophen) .Marland Kitchen.. 1 q 6 hours as needed pain 7)  Prozac 40 Mg Caps (Fluoxetine hcl) .... 2 capsules once daily 8)  Androgel Pump 1 % Gel (Testosterone) .... Apply 10 grams daily  Patient Instructions: 1)  It is important that you exercise reguarly at least 20 minutes 5 times a week. If you develop chest pain, have severe difficulty breathing, or feel very tired, stop exercising immediately and seek medical attention.  2)  You need to lose weight. Consider a lower calorie diet and regular exercise.  3)  get fasting labs  Prescriptions: ZOLPIDEM TARTRATE 10 MG TABS (ZOLPIDEM TARTRATE) 1 by mouth at bedtime  #30 x 5   Entered and Authorized by:   Nelwyn Salisbury MD   Signed by:   Nelwyn Salisbury MD on 08/18/2010   Method used:   Print then Give to Patient   RxID:   [id number] ETODOLAC 500 MG TABS (ETODOLAC) two times a day  #60 x 11   Entered and Authorized by:   Nelwyn Salisbury MD   Signed by:   Nelwyn Salisbury MD on 08/18/2010   Method used:   [facility]  [place] [address] [phone][facility] (retail[facility]  [address] [place] [address]       [place], [place]  [address]       Ph: [phone] or [phone]       Fax: [phone]   RxID:   [id number] LIPITOR 80 MG TABS (ATORVASTATIN CALCIUM) once daily  #30 x 11   Entered and Authorized by:   Nelwyn Salisbury MD   Signed by:   Nelwyn Salisbury MD on 08/18/2010    Method used:   [facility]        [facility]  [place] [address]ail[facility]       [address]       [place], [place]  [address]       Ph: [phone] or [phone]       Fax: [phone]   RxID:   [id number] LISINOPRIL-HYDROCHLOROTHIAZIDE 20-25 MG  TABS (LISINOPRIL-HYDROCHLOROTHIAZIDE) Take 1 tablet by mouth once a day  #30 x 11   Entered and Authorized by:   Nelwyn Salisbury MD   Signed by:   Nelwyn Salisbury MD on 08/18/2010   Method used:   [facility]        [facility]  [place] [address]ail)       [address]       [place], [place]  [address]       Ph: [phone] or [phone]       Fax: [phone]   RxID:   [id number] OMEPRAZOLE 20 MG  TBEC (OMEPRAZOLE) Take 1 tablet by mouth once a day  #30 x 11   Entered and Authorized by:   Nelwyn Salisbury MD   Signed by:   Nelwyn Salisbury MD on 08/18/2010   Method used:   [facility]        [facility]  [place] [address] ([facility]ail)       [address]       [place], [place]  [address]       Ph: [phone] or [phone]       Fax: [phone]   RxID:   [id number]   Appended Document: Orders Update    Clinical Lists Changes  Observations: Added new observation of COMMENTS: Wynona Canes, CMA  August 18, 2010 12:25 PM  (08/18/2010 12:24) Added new observation of PH URINE: 6.0  (08/18/2010 12:24) Added new observation of SPEC GR URIN: 1.025  (08/18/2010 12:24) Added new observation of APPEARANCE U: Clear  (08/18/2010 12:24) Added new observation of UA COLOR: yellow  (08/18/2010 12:24) Added new observation of WBC DIPSTK U: negative  (08/18/2010 12:24) Added new observation of NITRITE URN: negative  (08/18/2010 12:24) Added new observation of UROBILINOGEN: 0.2  (08/18/2010 12:24) Added new observation of PROTEIN, URN: 1+  (08/18/2010 12:24) Added new observation of BLOOD UR DIP: negative  (08/18/2010 12:24) Added new observation of KETONES URN: negative  (08/18/2010 12:24) Added new observation of BILIRUBIN UR: 3+   (08/18/2010 12:24) Added new observation of GLUCOSE, URN: negative  (08/18/2010 12:24)      Laboratory Results   Urine Tests  Date/Time Recieved: August 18, 2010 12:25 PM  Date/Time Reported: August 18, 2010 12:25 PM\par   Routine Urinalysis   Color: yellow Appearance: Clear Glucose: negative   (Normal Range: Negative) Bilirubin: 3+   (Normal Range: Negative) Ketone: negative   (Normal Range: Negative) Spec. Gravity: 1.025   (Normal Range: 1.003-1.035) Blood: negative   (Normal Range: Negative) pH: 6.0   (Normal Range: 5.0-8.0) Protein: 1+   (  Normal Range: Negative) Urobilinogen: 0.2   (Normal Range: 0-1) Nitrite: negative   (Normal Range: Negative) Leukocyte Esterace: negative   (Normal Range: Negative)    Comments: Wynona Canes, CMA  August 18, 2010 12:25 PM

## 2010-12-06 ENCOUNTER — Other Ambulatory Visit: Payer: Self-pay

## 2010-12-06 DIAGNOSIS — E291 Testicular hypofunction: Secondary | ICD-10-CM

## 2010-12-06 MED ORDER — TESTOSTERONE 12.5 MG/ACT (1%) TD GEL: 10.0000 g | Freq: Every day | TRANSDERMAL | Status: DC

## 2010-12-06 NOTE — Telephone Encounter (Signed)
[phone]

## 2011-01-16 ENCOUNTER — Ambulatory Visit (INDEPENDENT_AMBULATORY_CARE_PROVIDER_SITE_OTHER): Payer: Medicare Other | Admitting: Licensed Clinical Social Worker

## 2011-01-16 DIAGNOSIS — F4323 Adjustment disorder with mixed anxiety and depressed mood: Secondary | ICD-10-CM

## 2011-02-14 ENCOUNTER — Encounter: Payer: Self-pay | Admitting: Family Medicine

## 2011-02-21 ENCOUNTER — Ambulatory Visit: Payer: PRIVATE HEALTH INSURANCE | Admitting: Family Medicine

## 2011-02-27 ENCOUNTER — Ambulatory Visit (INDEPENDENT_AMBULATORY_CARE_PROVIDER_SITE_OTHER): Payer: Medicare Other | Admitting: Licensed Clinical Social Worker

## 2011-02-27 DIAGNOSIS — F331 Major depressive disorder, recurrent, moderate: Secondary | ICD-10-CM

## 2011-03-07 ENCOUNTER — Ambulatory Visit (INDEPENDENT_AMBULATORY_CARE_PROVIDER_SITE_OTHER): Payer: Medicare Other | Admitting: Licensed Clinical Social Worker

## 2011-03-07 DIAGNOSIS — F4323 Adjustment disorder with mixed anxiety and depressed mood: Secondary | ICD-10-CM

## 2011-03-21 ENCOUNTER — Ambulatory Visit (INDEPENDENT_AMBULATORY_CARE_PROVIDER_SITE_OTHER): Payer: Medicare Other | Admitting: Licensed Clinical Social Worker

## 2011-03-21 DIAGNOSIS — F331 Major depressive disorder, recurrent, moderate: Secondary | ICD-10-CM

## 2011-03-23 NOTE — Assessment & Plan Note (Signed)
[facility] OFFICE NOTE   NAME:[name], [name]                        MRN:          [id number]  DATE:07/01/2006                            DOB:          1945/12/14    This is a 65 year old gentleman here for a complete physical examination. He  feels fine and has no complaints at all.  He is happy with his current  medications.  His acid reflux is under control.  His depression is doing  well.  He is sleeping well etc.  He is urinating fine without medications.  We have also been following him for diet controlled diabetes, hyperlipidemia  and hypertension.  He admits to not following a smart diet at all and not  getting much exercise unfortunately.  Over the last year we did mention the  possibility of gastric bypass surgery.  He has checked into it and has found  that his insurance does not cover it.  Also with our physical last year he  does have a history of BPH with some elevated PSAs who referred him to  urology.  Unfortunately his insurance does not cover urologists in  [place].  He did see a urologist in [place] who told him everything  was fine and told him to come back in one year.  He would actually like to  see a different urologist that is closer to his home.   PAST MEDICAL HISTORY, FAMILY HISTORY, SOCIAL HISTORY, HABITS ETC.:  For  details of his past medical history, family history, social history, habits,  etc refer to his last physical note dated May 17, 2005.   ALLERGIES:  CODEINE.   CURRENT MEDICATIONS:  1. Omeprazole 20 mg per day.  2. Fluoxetine 20 mg per day.  3. Lipitor 40 mg per day.  4. Lisinopril HCT 20/25 once a day.  5. Ambien 10 mg q. h.s. as needed.   OBJECTIVE:  VITAL SIGNS:  Height 5 foot 8 inches.  Weight 280.  Blood  pressure 120/80.  Pulse 80 and regular.  GENERAL:  He remains obese.  SKIN:  Free of significant lesions.  AFFECT:  Bright.  EYES:  Sclerae is  clear.  Pharynx clear.  NECK:  Supple without lymphadenopathy, masses.  LUNGS:  Clear.  CARDIAC:  Regular rate and rhythm without gallops, murmurs or rubs.  Distal  pulses are full.  ABDOMEN:  Soft, normal bowel sounds.  Non-tender, no masses.  GENITALIA:  Normal male.  RECTAL:  No masses or tenderness.  Prostate smooth and mildly enlarged.  Stool is hemoccult negative.  EXTREMITIES:  No clubbing, cyanosis or edema.  NEUROLOGIC:  Grossly intact.   EKG is within normal limits.   ASSESSMENT/PLAN:   PROBLEM:  1. Complete physical.  We will get the usual laboratories today.  2. Hyperlipidemia.  Will check a fasting lipid panel.  3. Type 2 diabetes mellitus.  We will check laboratories today including a      hemoglobin A1C.  4. Hypertension - stable.  5. Benign prostatic hyperplasia. He will go home and check his insurance  book to see what urologists are covered in his area.  He will then let      [place] know so that we can setup a referral.  We will check a PSA today.  6. Gastroesophageal reflux disease - stable.  7. Obesity. We spent a good deal of time talking about the importance of      changing his diet and exercising to lose weight.  8. Depression - stable.                                   Tera Mater. Clent Ridges, MD   SAF/MedQ  DD:  07/01/2006  DT:  07/01/2006  Job #:  [id number]

## 2011-03-27 ENCOUNTER — Telehealth: Payer: Self-pay | Admitting: *Deleted

## 2011-03-27 NOTE — Telephone Encounter (Signed)
noted 

## 2011-03-27 NOTE — Telephone Encounter (Signed)
Pt was stung by a bee yesterday, and his arm is swelling with a rash.  Cannot get in the office today, but will try some benadryl tonight and if not better, he will come tomorrow.

## 2011-04-03 ENCOUNTER — Other Ambulatory Visit: Payer: Self-pay | Admitting: *Deleted

## 2011-04-03 NOTE — Telephone Encounter (Signed)
Refill zolpidem 10 mg 1 po qhs 

## 2011-04-04 MED ORDER — ZOLPIDEM TARTRATE 10 MG PO TABS: 10.0000 mg | ORAL_TABLET | Freq: Every evening | ORAL | Status: DC | PRN

## 2011-04-04 NOTE — Telephone Encounter (Signed)
Rx Done . 

## 2011-04-04 NOTE — Telephone Encounter (Signed)
Call  In #30 with 5 rf 

## 2011-04-05 ENCOUNTER — Other Ambulatory Visit: Payer: Self-pay | Admitting: Internal Medicine

## 2011-04-23 ENCOUNTER — Ambulatory Visit (INDEPENDENT_AMBULATORY_CARE_PROVIDER_SITE_OTHER): Payer: Medicare Other | Admitting: Family Medicine

## 2011-04-23 ENCOUNTER — Encounter: Payer: Self-pay | Admitting: Family Medicine

## 2011-04-23 VITALS — BP 136/92 | HR 80 | Temp 98.3°F | Ht 68.0 in | Wt 284.0 lb

## 2011-04-23 DIAGNOSIS — I10 Essential (primary) hypertension: Secondary | ICD-10-CM

## 2011-04-23 DIAGNOSIS — E669 Obesity, unspecified: Secondary | ICD-10-CM

## 2011-04-23 NOTE — Progress Notes (Signed)
  Subjective:    Patient ID: [name], male    DOB: February 12, 1946, 65 y.o.   MRN: [id number]  HPI Here for recent elevated BPs. He went to donate blood recently and his reading was 140/92. He feels good. He has cut back on his exercise and he admits to a poor diet. He has gained 12 lbs since last visit.    Review of Systems  Constitutional: Negative.   Respiratory: Negative.   Cardiovascular: Negative.        Objective:   Physical Exam  Constitutional: He appears well-developed and well-nourished.  Cardiovascular: Normal rate, regular rhythm, normal heart sounds and intact distal pulses.   Pulmonary/Chest: Effort normal and breath sounds normal.          Assessment & Plan:  We spent a lot of time discussing his weight. He will exercise more and eat haelthier. Recheck in 2 months

## 2011-04-26 NOTE — Progress Notes (Signed)
  Subjective:    Patient ID: [name], male    DOB: May 11, 1946, 65 y.o.   MRN: [id number]  HPI    Review of Systems     Objective:   Physical Exam        Assessment & Plan:  We spoke for 45 minutes about the necessity of losing weight, and 90% of this was spent face to face coordinating a treatment plan.

## 2011-07-03 ENCOUNTER — Ambulatory Visit (INDEPENDENT_AMBULATORY_CARE_PROVIDER_SITE_OTHER): Payer: Medicare Other | Admitting: Licensed Clinical Social Worker

## 2011-07-03 DIAGNOSIS — F331 Major depressive disorder, recurrent, moderate: Secondary | ICD-10-CM

## 2011-07-06 ENCOUNTER — Ambulatory Visit (INDEPENDENT_AMBULATORY_CARE_PROVIDER_SITE_OTHER): Payer: Medicare Other | Admitting: Family Medicine

## 2011-07-06 ENCOUNTER — Encounter: Payer: Self-pay | Admitting: Family Medicine

## 2011-07-06 VITALS — BP 130/78 | HR 79 | Temp 98.6°F | Wt 286.0 lb

## 2011-07-06 DIAGNOSIS — R059 Cough, unspecified: Secondary | ICD-10-CM

## 2011-07-06 DIAGNOSIS — J40 Bronchitis, not specified as acute or chronic: Secondary | ICD-10-CM

## 2011-07-06 DIAGNOSIS — R05 Cough: Secondary | ICD-10-CM

## 2011-07-06 MED ORDER — AZITHROMYCIN 250 MG PO TABS: ORAL_TABLET | ORAL | Status: AC

## 2011-07-06 NOTE — Progress Notes (Signed)
  Subjective:    Patient ID: [name], male    DOB: [birth year], 65 y.o.   MRN: [id number]  HPI Here for one week of chest tightness and a dry cough. No fever or chest pain.    Review of Systems  Constitutional: Negative.   HENT: Positive for congestion, postnasal drip and sinus pressure. Negative for sore throat.   Eyes: Negative.   Respiratory: Positive for cough.        Objective:   Physical Exam  Constitutional: He appears well-developed and well-nourished.  HENT:  Head: Normocephalic.  Right Ear: External ear normal.  Left Ear: External ear normal.  Nose: Nose normal.  Mouth/Throat: Oropharynx is clear and moist. No oropharyngeal exudate.  Eyes: Conjunctivae are normal. Pupils are equal, round, and reactive to light.  Neck: Normal range of motion. Neck supple. No thyromegaly present.  Pulmonary/Chest: Effort normal. No respiratory distress. He has no wheezes. He has no rales. He exhibits no tenderness.  Lymphadenopathy:    He has no cervical adenopathy.          Assessment & Plan:  Rest, fluids

## 2011-08-17 ENCOUNTER — Ambulatory Visit (INDEPENDENT_AMBULATORY_CARE_PROVIDER_SITE_OTHER): Payer: Medicare Other | Admitting: Family Medicine

## 2011-08-17 ENCOUNTER — Encounter: Payer: Self-pay | Admitting: Family Medicine

## 2011-08-17 VITALS — BP 124/86 | HR 82 | Temp 97.9°F | Ht 66.75 in | Wt 288.0 lb

## 2011-08-17 DIAGNOSIS — E119 Type 2 diabetes mellitus without complications: Secondary | ICD-10-CM

## 2011-08-17 DIAGNOSIS — Z136 Encounter for screening for cardiovascular disorders: Secondary | ICD-10-CM

## 2011-08-17 DIAGNOSIS — N138 Other obstructive and reflux uropathy: Secondary | ICD-10-CM

## 2011-08-17 DIAGNOSIS — I10 Essential (primary) hypertension: Secondary | ICD-10-CM

## 2011-08-17 DIAGNOSIS — N401 Enlarged prostate with lower urinary tract symptoms: Secondary | ICD-10-CM

## 2011-08-17 DIAGNOSIS — N139 Obstructive and reflux uropathy, unspecified: Secondary | ICD-10-CM

## 2011-08-17 LAB — TSH: TSH: 0.84 u[IU]/mL (ref 0.35–5.50)

## 2011-08-17 LAB — LIPID PANEL
Cholesterol: 145 mg/dL (ref 0–200)
HDL: 40.7 mg/dL (ref >39.00)
Total CHOL/HDL Ratio: 4
Triglycerides: 52 mg/dL (ref 0.0–149.0)

## 2011-08-17 LAB — BASIC METABOLIC PANEL
BUN: 21 mg/dL (ref 6–23)
CO2: 31 mEq/L (ref 19–32)
Calcium: 10.3 mg/dL (ref 8.4–10.5)
Creatinine, Ser: 0.7 mg/dL (ref 0.4–1.5)
Glucose, Bld: 158 mg/dL — ABNORMAL HIGH (ref 70–99)

## 2011-08-17 LAB — HEMOGLOBIN A1C: Hgb A1c MFr Bld: 7.9 % — ABNORMAL HIGH (ref 4.6–6.5)

## 2011-08-17 LAB — HEPATIC FUNCTION PANEL
ALT: 33 U/L (ref 0–53)
Albumin: 4.2 g/dL (ref 3.5–5.2)
Total Protein: 7.1 g/dL (ref 6.0–8.3)

## 2011-08-17 LAB — CBC WITH DIFFERENTIAL/PLATELET
Basophils Absolute: 0.1 10*3/uL (ref 0.0–0.1)
Eosinophils Absolute: 0.5 10*3/uL (ref 0.0–0.7)
Lymphocytes Relative: 25.2 % (ref 12.0–46.0)
Lymphs Abs: 2.3 10*3/uL (ref 0.7–4.0)
MCHC: 33 g/dL (ref 30.0–36.0)
Monocytes Relative: 10 % (ref 3.0–12.0)
Platelets: 294 10*3/uL (ref 150.0–400.0)
RDW: 13.8 % (ref 11.5–14.6)

## 2011-08-17 LAB — POCT URINALYSIS DIPSTICK
Leukocytes, UA: NEGATIVE
Urobilinogen, UA: 0.2
pH, UA: 5.5

## 2011-08-17 MED ORDER — LISINOPRIL-HYDROCHLOROTHIAZIDE 20-25 MG PO TABS: 1.0000 | ORAL_TABLET | Freq: Every day | ORAL | Status: DC

## 2011-08-17 MED ORDER — ATORVASTATIN CALCIUM 80 MG PO TABS: 80.0000 mg | ORAL_TABLET | Freq: Every day | ORAL | Status: DC

## 2011-08-17 MED ORDER — OMEPRAZOLE 20 MG PO CPDR: 20.0000 mg | DELAYED_RELEASE_CAPSULE | Freq: Every day | ORAL | Status: DC

## 2011-08-17 NOTE — Progress Notes (Signed)
  Subjective:    Patient ID: [name], male    DOB: Oct 22, 1946, 65 y.o.   MRN: [id number]  HPI 65 yr old male for a cpx. He feels well and has no complaints. He knows he is overweight and is watching his diet. He gets little exercise however.    Review of Systems  Constitutional: Negative.   HENT: Negative.   Eyes: Negative.   Respiratory: Negative.   Cardiovascular: Negative.   Gastrointestinal: Negative.   Genitourinary: Negative.   Musculoskeletal: Negative.   Skin: Negative.   Neurological: Negative.   Hematological: Negative.   Psychiatric/Behavioral: Negative.        Objective:   Physical Exam  Constitutional: He is oriented to person, place, and time. He appears well-developed and well-nourished. No distress.  HENT:  Head: Normocephalic and atraumatic.  Right Ear: External ear normal.  Left Ear: External ear normal.  Nose: Nose normal.  Mouth/Throat: Oropharynx is clear and moist. No oropharyngeal exudate.  Eyes: Conjunctivae and EOM are normal. Pupils are equal, round, and reactive to light. Right eye exhibits no discharge. Left eye exhibits no discharge. No scleral icterus.  Neck: Neck supple. No JVD present. No tracheal deviation present. No thyromegaly present.  Cardiovascular: Normal rate, regular rhythm, normal heart sounds and intact distal pulses.  Exam reveals no gallop and no friction rub.   No murmur heard.      EKG normal   Pulmonary/Chest: Effort normal and breath sounds normal. No respiratory distress. He has no wheezes. He has no rales. He exhibits no tenderness.  Abdominal: Soft. Bowel sounds are normal. He exhibits no distension and no mass. There is no tenderness. There is no rebound and no guarding.  Genitourinary: Rectum normal, prostate normal and penis normal. Guaiac negative stool. No penile tenderness.  Musculoskeletal: Normal range of motion. He exhibits no edema and no tenderness.  Lymphadenopathy:    He has no cervical adenopathy.    Neurological: He is alert and oriented to person, place, and time. He has normal reflexes. No cranial nerve deficit. He exhibits normal muscle tone. Coordination normal.  Skin: Skin is warm and dry. No rash noted. He is not diaphoretic. No erythema. No pallor.  Psychiatric: He has a normal mood and affect. His behavior is normal. Judgment and thought content normal.          Assessment & Plan:  Get fasting labs. He needs to exercise daily and lose weight.

## 2011-08-21 ENCOUNTER — Telehealth: Payer: Self-pay | Admitting: Family Medicine

## 2011-08-21 ENCOUNTER — Encounter: Payer: Self-pay | Admitting: Family Medicine

## 2011-08-21 MED ORDER — METFORMIN HCL 500 MG PO TABS: 500.0000 mg | ORAL_TABLET | Freq: Two times a day (BID) | ORAL | Status: DC

## 2011-08-21 NOTE — Telephone Encounter (Signed)
Left voice message with results and sent new script e-scribe.

## 2011-08-21 NOTE — Progress Notes (Signed)
Addended by: Aniceto Boss A on: 08/21/2011 03:27 PM   Modules accepted: Orders

## 2011-08-21 NOTE — Telephone Encounter (Signed)
Message copied by Baldemar Friday on Tue Aug 21, 2011  3:27 PM ------      Message from: Gershon Crane A      Created: Fri Aug 17, 2011  4:32 PM       Normal except his diabetes is a little worse. He needs to lose weight. Start on Metformin 500 mg bid. Call in one year supply. Recheck an A1c in 3 months. His PSA is elevated but stable. He will follow up with his Urologist for this

## 2011-09-26 ENCOUNTER — Ambulatory Visit (INDEPENDENT_AMBULATORY_CARE_PROVIDER_SITE_OTHER): Payer: Medicare Other | Admitting: Licensed Clinical Social Worker

## 2011-09-26 DIAGNOSIS — F331 Major depressive disorder, recurrent, moderate: Secondary | ICD-10-CM

## 2011-11-12 ENCOUNTER — Ambulatory Visit (INDEPENDENT_AMBULATORY_CARE_PROVIDER_SITE_OTHER): Payer: Medicare Other | Admitting: Licensed Clinical Social Worker

## 2011-11-12 DIAGNOSIS — F331 Major depressive disorder, recurrent, moderate: Secondary | ICD-10-CM | POA: Diagnosis not present

## 2011-11-20 ENCOUNTER — Ambulatory Visit (INDEPENDENT_AMBULATORY_CARE_PROVIDER_SITE_OTHER): Payer: Medicare Other | Admitting: Licensed Clinical Social Worker

## 2011-11-20 DIAGNOSIS — F331 Major depressive disorder, recurrent, moderate: Secondary | ICD-10-CM

## 2011-11-29 ENCOUNTER — Ambulatory Visit (INDEPENDENT_AMBULATORY_CARE_PROVIDER_SITE_OTHER): Payer: Medicare Other | Admitting: Licensed Clinical Social Worker

## 2011-11-29 DIAGNOSIS — F331 Major depressive disorder, recurrent, moderate: Secondary | ICD-10-CM

## 2011-12-06 ENCOUNTER — Ambulatory Visit (INDEPENDENT_AMBULATORY_CARE_PROVIDER_SITE_OTHER): Payer: Medicare Other | Admitting: Licensed Clinical Social Worker

## 2011-12-06 DIAGNOSIS — F331 Major depressive disorder, recurrent, moderate: Secondary | ICD-10-CM | POA: Diagnosis not present

## 2011-12-11 ENCOUNTER — Ambulatory Visit: Payer: Medicare Other | Admitting: Licensed Clinical Social Worker

## 2011-12-18 ENCOUNTER — Ambulatory Visit (INDEPENDENT_AMBULATORY_CARE_PROVIDER_SITE_OTHER): Payer: Medicare Other | Admitting: Licensed Clinical Social Worker

## 2011-12-18 DIAGNOSIS — F331 Major depressive disorder, recurrent, moderate: Secondary | ICD-10-CM

## 2012-01-10 ENCOUNTER — Ambulatory Visit (INDEPENDENT_AMBULATORY_CARE_PROVIDER_SITE_OTHER): Payer: Medicare Other | Admitting: Family Medicine

## 2012-01-10 ENCOUNTER — Encounter: Payer: Self-pay | Admitting: Family Medicine

## 2012-01-10 VITALS — BP 124/80 | HR 87 | Temp 98.4°F | Wt 300.0 lb

## 2012-01-10 DIAGNOSIS — E119 Type 2 diabetes mellitus without complications: Secondary | ICD-10-CM

## 2012-01-10 DIAGNOSIS — I10 Essential (primary) hypertension: Secondary | ICD-10-CM | POA: Diagnosis not present

## 2012-01-10 DIAGNOSIS — R058 Other specified cough: Secondary | ICD-10-CM

## 2012-01-10 DIAGNOSIS — M6530 Trigger finger, unspecified finger: Secondary | ICD-10-CM | POA: Diagnosis not present

## 2012-01-10 DIAGNOSIS — R059 Cough, unspecified: Secondary | ICD-10-CM

## 2012-01-10 DIAGNOSIS — R05 Cough: Secondary | ICD-10-CM

## 2012-01-10 LAB — HEPATIC FUNCTION PANEL
Albumin: 4.1 g/dL (ref 3.5–5.2)
Alkaline Phosphatase: 54 U/L (ref 39–117)

## 2012-01-10 LAB — LIPID PANEL
HDL: 39 mg/dL — ABNORMAL LOW (ref >39.00)
Total CHOL/HDL Ratio: 4

## 2012-01-10 MED ORDER — LOSARTAN POTASSIUM-HCTZ 50-12.5 MG PO TABS: 1.0000 | ORAL_TABLET | Freq: Every day | ORAL | Status: DC

## 2012-01-10 NOTE — Progress Notes (Signed)
  Subjective:    Patient ID: [name], male    DOB: [birth year], 66 y.o.   MRN: [id number]  HPI Here to follow up on diabetes, BP, and lipids. He admits to eating poorly and getting little exercise. He is fasting for labs. He describes a dry tickling cough that comes and goes for the past few months. He does not feel sick. He also has a catching sensation in the 3rd, 4th, and 5th fingers of the left hand.    Review of Systems  Constitutional: Negative.   Respiratory: Positive for cough. Negative for shortness of breath and wheezing.   Cardiovascular: Negative.        Objective:   Physical Exam  Constitutional: He appears well-developed and well-nourished.  Cardiovascular: Normal rate, regular rhythm, normal heart sounds and intact distal pulses.   Pulmonary/Chest: Effort normal and breath sounds normal.  Musculoskeletal:       He has a catch in the left 3rd PIP joint, full ROM           Assessment & Plan:  He is not taking good care of himself, and we discussed better diet and exercise. Get labs today including an A1c. I think his cough is from his ACE inhibitor, so will weill switch form lisinopril HCT to Losartan HCT. He has some trigger fingers that are mild. He does not wish to have this treated at this point.

## 2012-01-16 ENCOUNTER — Telehealth: Payer: Self-pay | Admitting: Family Medicine

## 2012-01-16 NOTE — Telephone Encounter (Signed)
Pt requesting his lab results.

## 2012-01-17 NOTE — Telephone Encounter (Signed)
Results are in and I did call pt. I'm waiting on pt to return my call.

## 2012-01-17 NOTE — Progress Notes (Signed)
Quick Note:  Left voice message for pt to return my call ______ 

## 2012-01-18 NOTE — Progress Notes (Signed)
Quick Note:  Spoke with pt ______ 

## 2012-01-30 ENCOUNTER — Telehealth: Payer: Self-pay | Admitting: Family Medicine

## 2012-01-30 NOTE — Telephone Encounter (Signed)
Spoke with pt and he wants to know if you can recommend a diabetic medication that he only has to take once a day. He cannot remember to take the Metformin twice a day.

## 2012-01-31 NOTE — Telephone Encounter (Signed)
Left voice message with below information. 

## 2012-01-31 NOTE — Telephone Encounter (Signed)
Of all the diabetic meds available, Metformin is the most important and it must be taken twice a day. He needs to figure out a way to remember this

## 2012-02-08 ENCOUNTER — Telehealth: Payer: Self-pay | Admitting: Family Medicine

## 2012-02-08 MED ORDER — LOSARTAN POTASSIUM-HCTZ 50-12.5 MG PO TABS: 1.0000 | ORAL_TABLET | Freq: Every day | ORAL | Status: DC

## 2012-02-08 MED ORDER — METFORMIN HCL 500 MG PO TABS: 500.0000 mg | ORAL_TABLET | Freq: Two times a day (BID) | ORAL | Status: DC

## 2012-02-08 MED ORDER — ATORVASTATIN CALCIUM 80 MG PO TABS: 80.0000 mg | ORAL_TABLET | Freq: Every day | ORAL | Status: DC

## 2012-02-08 MED ORDER — OMEPRAZOLE 20 MG PO CPDR: 20.0000 mg | DELAYED_RELEASE_CAPSULE | Freq: Every day | ORAL | Status: DC

## 2012-02-08 NOTE — Telephone Encounter (Signed)
Script sents e-scribe and sent to [facility] ( 90 day supply )

## 2012-02-21 ENCOUNTER — Telehealth: Payer: Self-pay | Admitting: Family Medicine

## 2012-02-21 NOTE — Telephone Encounter (Signed)
Called pt and sch ov for 02/22/12 per Nettie Elm. Pt req to come in a 1pm. Appt made.

## 2012-02-21 NOTE — Telephone Encounter (Signed)
Pt would like to schedule a office visit for 02/22/12. Can you call and schedule this?

## 2012-02-22 ENCOUNTER — Ambulatory Visit (INDEPENDENT_AMBULATORY_CARE_PROVIDER_SITE_OTHER): Payer: Medicare Other | Admitting: Family Medicine

## 2012-02-22 ENCOUNTER — Encounter: Payer: Self-pay | Admitting: Family Medicine

## 2012-02-22 VITALS — BP 120/82 | HR 65 | Temp 98.7°F | Wt 306.0 lb

## 2012-02-22 DIAGNOSIS — R609 Edema, unspecified: Secondary | ICD-10-CM | POA: Diagnosis not present

## 2012-02-22 DIAGNOSIS — R0602 Shortness of breath: Secondary | ICD-10-CM | POA: Diagnosis not present

## 2012-02-22 LAB — CBC WITH DIFFERENTIAL/PLATELET
Basophils Relative: 0.5 % (ref 0.0–3.0)
Eosinophils Relative: 4.4 % (ref 0.0–5.0)
Hemoglobin: 14.8 g/dL (ref 13.0–17.0)
Lymphocytes Relative: 26.3 % (ref 12.0–46.0)
Monocytes Relative: 10.7 % (ref 3.0–12.0)
Neutro Abs: 6 10*3/uL (ref 1.4–7.7)
RBC: 4.88 Mil/uL (ref 4.22–5.81)
WBC: 10.3 10*3/uL (ref 4.5–10.5)

## 2012-02-22 LAB — POCT URINALYSIS DIPSTICK
Bilirubin, UA: NEGATIVE
Glucose, UA: NEGATIVE
Ketones, UA: NEGATIVE
pH, UA: 6

## 2012-02-22 LAB — TSH: TSH: 0.72 u[IU]/mL (ref 0.35–5.50)

## 2012-02-22 LAB — BASIC METABOLIC PANEL
BUN: 13 mg/dL (ref 6–23)
Chloride: 105 mEq/L (ref 96–112)
Potassium: 4.1 mEq/L (ref 3.5–5.1)

## 2012-02-22 MED ORDER — FUROSEMIDE 40 MG PO TABS: 40.0000 mg | ORAL_TABLET | Freq: Every day | ORAL | Status: DC

## 2012-02-25 ENCOUNTER — Encounter: Payer: Self-pay | Admitting: Family Medicine

## 2012-02-25 NOTE — Progress Notes (Signed)
  Subjective:    Patient ID: [name], male    DOB: 1945-11-17, 66 y.o.   MRN: [id number]  HPI Here for several weeks of swelling in the legs, mild SOB, and some weight gain. No chest pains or palpitations. No change in his medications, but he does admit to starting some supplements OTC several weeks ago from [facility]. One of these is a so called "adrenal supplement" that makes your adrenal glands work better. He has been watching his diet and starting to walk for exercise again.    Review of Systems  Constitutional: Negative.   Respiratory: Positive for shortness of breath. Negative for cough, chest tightness and wheezing.   Cardiovascular: Positive for leg swelling. Negative for chest pain and palpitations.  Gastrointestinal: Negative.   Genitourinary: Negative.   Neurological: Negative.        Objective:   Physical Exam  Constitutional: He appears well-developed and well-nourished. No distress.  Neck: Neck supple. No thyromegaly present.  Cardiovascular: Normal rate, regular rhythm, normal heart sounds and intact distal pulses.  Exam reveals no gallop and no friction rub.   No murmur heard. Pulmonary/Chest: Effort normal and breath sounds normal. No respiratory distress. He has no wheezes. He has no rales.  Musculoskeletal:       1+ edema to the legs and feet  Lymphadenopathy:    He has no cervical adenopathy.          Assessment & Plan:  He seems to be retaining some fluids, and I think this co called adrenal supplement is to blame. It may contain some glucocorticoids or mineralocorticoids that cause him retain sodium and therefore water. He will stoop all OTC supplements, and we will start on low dose Lasix for diuresis. Get labs to day to rule out renal problems, CHF, etc. Recheck in 3 weeks

## 2012-02-26 NOTE — Progress Notes (Signed)
Quick Note:  Pt informed ______ 

## 2012-03-19 ENCOUNTER — Encounter: Payer: Self-pay | Admitting: Family Medicine

## 2012-03-19 ENCOUNTER — Ambulatory Visit (INDEPENDENT_AMBULATORY_CARE_PROVIDER_SITE_OTHER): Payer: Medicare Other | Admitting: Family Medicine

## 2012-03-19 VITALS — BP 124/70 | HR 96 | Temp 98.8°F | Wt 295.0 lb

## 2012-03-19 DIAGNOSIS — F419 Anxiety disorder, unspecified: Secondary | ICD-10-CM

## 2012-03-19 DIAGNOSIS — F411 Generalized anxiety disorder: Secondary | ICD-10-CM

## 2012-03-19 DIAGNOSIS — R609 Edema, unspecified: Secondary | ICD-10-CM | POA: Diagnosis not present

## 2012-03-19 MED ORDER — LORAZEPAM 1 MG PO TABS: 1.0000 mg | ORAL_TABLET | Freq: Four times a day (QID) | ORAL | Status: AC | PRN

## 2012-03-19 NOTE — Progress Notes (Signed)
  Subjective:    Patient ID: [name], male    DOB: Nov 23, 1945, 66 y.o.   MRN: [id number]  HPI Here to follow up on edema and for other issues. We saw him several weeks ago for swelling in the hands and legs and for SOB. We determined that he had fluid retention as a side effect of some supplements he had been talking. He stopped these, and we added Lasix daily. Now he feels better, his breathing is better, and the swelling is down. All his labs were normal. He has lost 11 lbs since that visit. He also asks for something to help him relax of plane flights. He will be flying to the [place] in a few weeks to marry a woman he met over an Internet dating site. He is very afraid of flying.    Review of Systems  Constitutional: Negative.   Respiratory: Negative.   Cardiovascular: Positive for leg swelling. Negative for chest pain and palpitations.  Psychiatric/Behavioral: The patient is nervous/anxious.        Objective:   Physical Exam  Constitutional: He appears well-developed and well-nourished.  Cardiovascular: Normal rate, regular rhythm, normal heart sounds and intact distal pulses.   Pulmonary/Chest: Effort normal and breath sounds normal.  Musculoskeletal:       Trace edema in the lower legs           Assessment & Plan:  He will stay on Lasix. Try Ativan for flying.

## 2012-04-30 ENCOUNTER — Ambulatory Visit (INDEPENDENT_AMBULATORY_CARE_PROVIDER_SITE_OTHER): Payer: Medicare Other | Admitting: Licensed Clinical Social Worker

## 2012-04-30 DIAGNOSIS — F331 Major depressive disorder, recurrent, moderate: Secondary | ICD-10-CM

## 2012-05-06 ENCOUNTER — Ambulatory Visit (INDEPENDENT_AMBULATORY_CARE_PROVIDER_SITE_OTHER): Payer: Medicare Other | Admitting: Licensed Clinical Social Worker

## 2012-05-06 DIAGNOSIS — F331 Major depressive disorder, recurrent, moderate: Secondary | ICD-10-CM

## 2012-05-13 ENCOUNTER — Ambulatory Visit (INDEPENDENT_AMBULATORY_CARE_PROVIDER_SITE_OTHER): Payer: Medicare Other | Admitting: Licensed Clinical Social Worker

## 2012-05-13 DIAGNOSIS — F331 Major depressive disorder, recurrent, moderate: Secondary | ICD-10-CM | POA: Diagnosis not present

## 2012-05-16 ENCOUNTER — Ambulatory Visit (INDEPENDENT_AMBULATORY_CARE_PROVIDER_SITE_OTHER): Payer: Medicare Other | Admitting: Family Medicine

## 2012-05-16 ENCOUNTER — Encounter: Payer: Self-pay | Admitting: Family Medicine

## 2012-05-16 VITALS — BP 112/60 | HR 79 | Temp 98.6°F | Wt 290.0 lb

## 2012-05-16 DIAGNOSIS — K529 Noninfective gastroenteritis and colitis, unspecified: Secondary | ICD-10-CM

## 2012-05-16 DIAGNOSIS — K5289 Other specified noninfective gastroenteritis and colitis: Secondary | ICD-10-CM | POA: Diagnosis not present

## 2012-05-16 MED ORDER — ALIGN 4 MG PO CAPS: 4.0000 mg | ORAL_CAPSULE | Freq: Every day | ORAL | Status: DC

## 2012-05-16 MED ORDER — METRONIDAZOLE 500 MG PO TABS: 500.0000 mg | ORAL_TABLET | Freq: Three times a day (TID) | ORAL | Status: AC

## 2012-05-16 NOTE — Progress Notes (Signed)
  Subjective:    Patient ID: [name], male    DOB: 23-Apr-1946, 66 y.o.   MRN: [id number]  HPI Here for 6 weeks of daily diarrhea with mild cramping. This started about 20 minutes after he ate a chicken Caesar salad he bought, and it has continued since then. No nausea or vomiting. No fever.    Review of Systems  Constitutional: Negative.   Gastrointestinal: Positive for abdominal pain and diarrhea. Negative for nausea, vomiting, constipation, blood in stool, abdominal distention and rectal pain.  Genitourinary: Negative.        Objective:   Physical Exam  Constitutional: He appears well-developed and well-nourished.  Abdominal: Soft. Bowel sounds are normal. He exhibits no distension and no mass. There is no tenderness. There is no rebound and no guarding.          Assessment & Plan:  Treat with Flagyl and a probiotic. Recheck prn

## 2012-05-27 ENCOUNTER — Ambulatory Visit (INDEPENDENT_AMBULATORY_CARE_PROVIDER_SITE_OTHER): Payer: Medicare Other | Admitting: Licensed Clinical Social Worker

## 2012-05-27 DIAGNOSIS — F331 Major depressive disorder, recurrent, moderate: Secondary | ICD-10-CM

## 2012-05-30 ENCOUNTER — Ambulatory Visit (INDEPENDENT_AMBULATORY_CARE_PROVIDER_SITE_OTHER): Payer: Medicare Other | Admitting: Family Medicine

## 2012-05-30 ENCOUNTER — Encounter: Payer: Self-pay | Admitting: Family Medicine

## 2012-05-30 VITALS — BP 110/76 | HR 87 | Temp 98.3°F | Wt 289.0 lb

## 2012-05-30 DIAGNOSIS — M79609 Pain in unspecified limb: Secondary | ICD-10-CM | POA: Diagnosis not present

## 2012-05-30 DIAGNOSIS — F341 Dysthymic disorder: Secondary | ICD-10-CM

## 2012-05-30 DIAGNOSIS — F418 Other specified anxiety disorders: Secondary | ICD-10-CM

## 2012-05-30 DIAGNOSIS — M79646 Pain in unspecified finger(s): Secondary | ICD-10-CM

## 2012-05-30 MED ORDER — FLUOXETINE HCL 40 MG PO CAPS: 40.0000 mg | ORAL_CAPSULE | Freq: Every day | ORAL | Status: DC

## 2012-05-30 NOTE — Progress Notes (Signed)
  Subjective:    Patient ID: [name], male    DOB: 1946-05-18, 66 y.o.   MRN: [id number]  HPI Here for 2 things. First he has had stiffness, pain ,and locking if the 2nd, 3rd, and 4th fingers of the left hand for around 6 months. Second, he wants to get back on Prozac. He stopped this around February, but lately he has been under a lot of stress with family issues. He has a number of disagreements with his daughters and sons-in-law.    Review of Systems  Constitutional: Negative.   Musculoskeletal: Positive for arthralgias.  Psychiatric/Behavioral: Positive for dysphoric mood and decreased concentration. The patient is nervous/anxious.        Objective:   Physical Exam  Constitutional: He appears well-developed and well-nourished.  Musculoskeletal:       Stiffness of the left hand fingers as above, not tender or swollen  Psychiatric: His behavior is normal. Thought content normal.       Anxious           Assessment & Plan:  We will refer to Hand Surgery for the fingers. Get back on Prozac 40 mg a day. Recheck on 2 weeks

## 2012-06-03 ENCOUNTER — Ambulatory Visit (INDEPENDENT_AMBULATORY_CARE_PROVIDER_SITE_OTHER): Payer: Medicare Other | Admitting: Licensed Clinical Social Worker

## 2012-06-03 DIAGNOSIS — F331 Major depressive disorder, recurrent, moderate: Secondary | ICD-10-CM

## 2012-06-09 DIAGNOSIS — M6530 Trigger finger, unspecified finger: Secondary | ICD-10-CM | POA: Diagnosis not present

## 2012-06-11 ENCOUNTER — Ambulatory Visit (INDEPENDENT_AMBULATORY_CARE_PROVIDER_SITE_OTHER): Payer: Medicare Other | Admitting: Licensed Clinical Social Worker

## 2012-06-11 DIAGNOSIS — F331 Major depressive disorder, recurrent, moderate: Secondary | ICD-10-CM | POA: Diagnosis not present

## 2012-06-19 ENCOUNTER — Telehealth: Payer: Self-pay | Admitting: Family Medicine

## 2012-06-19 ENCOUNTER — Telehealth: Payer: Self-pay

## 2012-06-19 NOTE — Telephone Encounter (Signed)
VM from pt - need to increase prozac to 80mg  as discussed at last visit - did 40mg  for 2 wks - felt the need to increase so started taking 2 this week - much better - please advise ok to call in new rx for 80 mg qd

## 2012-06-19 NOTE — Telephone Encounter (Signed)
Caller: [name]/Patient; Patient Name: [name]; PCP: Nelwyn Salisbury.; Best Callback Phone Number: [phone].  Pt states he was in to see Dr 5-6 weeks ago for diarrhea.  Pt was placed on Flagyl and it has not helped, he is still having diarrhea.  All emergent symptoms of Diarrhea or Other Change In Bowel Habits ruled out with exception to 'Evaluated by provider and symptoms are not improving or worsening with suggested treatment or home care'.  Home care advice given and appt scheduled with Dr Clent Ridges on 06/20/12 at 1:15

## 2012-06-20 ENCOUNTER — Encounter: Payer: Self-pay | Admitting: Family Medicine

## 2012-06-20 ENCOUNTER — Ambulatory Visit (INDEPENDENT_AMBULATORY_CARE_PROVIDER_SITE_OTHER): Payer: Medicare Other | Admitting: Family Medicine

## 2012-06-20 VITALS — BP 120/80 | Temp 98.4°F | Wt 276.0 lb

## 2012-06-20 DIAGNOSIS — F419 Anxiety disorder, unspecified: Secondary | ICD-10-CM

## 2012-06-20 DIAGNOSIS — K589 Irritable bowel syndrome without diarrhea: Secondary | ICD-10-CM

## 2012-06-20 DIAGNOSIS — F411 Generalized anxiety disorder: Secondary | ICD-10-CM | POA: Diagnosis not present

## 2012-06-20 MED ORDER — FLUOXETINE HCL 40 MG PO CAPS: ORAL_CAPSULE | ORAL | Status: DC

## 2012-06-20 MED ORDER — DICYCLOMINE HCL 20 MG PO TABS: 20.0000 mg | ORAL_TABLET | Freq: Three times a day (TID) | ORAL | Status: DC

## 2012-06-20 NOTE — Telephone Encounter (Signed)
done

## 2012-06-20 NOTE — Telephone Encounter (Signed)
Agreed. Call in Prozac 40 mg to take bid, #60 with 11 rf

## 2012-06-20 NOTE — Progress Notes (Signed)
  Subjective:    Patient ID: [name], male    DOB: Oct 16, 1946, 66 y.o.   MRN: [id number]  HPI Here for daily diarrhea that started last May. No cramping or fevers. Last month we tried a course of Flagyl but this did not help. He has been under a lot of stress this summer with family issues.    Review of Systems  Constitutional: Negative.   Respiratory: Negative.   Cardiovascular: Negative.   Gastrointestinal: Positive for diarrhea. Negative for nausea, vomiting, abdominal pain, constipation, blood in stool, abdominal distention and rectal pain.       Objective:   Physical Exam  Constitutional: He appears well-developed and well-nourished.  Abdominal: Soft. Bowel sounds are normal. He exhibits no distension and no mass. There is no tenderness. There is no rebound and no guarding.          Assessment & Plan:  This is consistent with IBS, and this is related to high levels of anxiety lately. Try Bentyl tid.

## 2012-06-24 ENCOUNTER — Ambulatory Visit (INDEPENDENT_AMBULATORY_CARE_PROVIDER_SITE_OTHER): Payer: Medicare Other | Admitting: Licensed Clinical Social Worker

## 2012-06-24 DIAGNOSIS — F331 Major depressive disorder, recurrent, moderate: Secondary | ICD-10-CM | POA: Diagnosis not present

## 2012-07-01 ENCOUNTER — Ambulatory Visit (INDEPENDENT_AMBULATORY_CARE_PROVIDER_SITE_OTHER): Payer: Medicare Other | Admitting: Licensed Clinical Social Worker

## 2012-07-01 DIAGNOSIS — F331 Major depressive disorder, recurrent, moderate: Secondary | ICD-10-CM | POA: Diagnosis not present

## 2012-07-15 ENCOUNTER — Ambulatory Visit (INDEPENDENT_AMBULATORY_CARE_PROVIDER_SITE_OTHER): Payer: Medicare Other | Admitting: Licensed Clinical Social Worker

## 2012-07-15 DIAGNOSIS — F331 Major depressive disorder, recurrent, moderate: Secondary | ICD-10-CM

## 2012-08-25 ENCOUNTER — Encounter: Payer: Self-pay | Admitting: Family Medicine

## 2012-08-25 ENCOUNTER — Ambulatory Visit (INDEPENDENT_AMBULATORY_CARE_PROVIDER_SITE_OTHER): Payer: Medicare Other | Admitting: Family Medicine

## 2012-08-25 VITALS — BP 118/70 | HR 76 | Temp 98.2°F | Ht 67.5 in | Wt 276.0 lb

## 2012-08-25 DIAGNOSIS — N138 Other obstructive and reflux uropathy: Secondary | ICD-10-CM

## 2012-08-25 DIAGNOSIS — N139 Obstructive and reflux uropathy, unspecified: Secondary | ICD-10-CM | POA: Diagnosis not present

## 2012-08-25 DIAGNOSIS — Z0000 Encounter for general adult medical examination without abnormal findings: Secondary | ICD-10-CM

## 2012-08-25 DIAGNOSIS — N401 Enlarged prostate with lower urinary tract symptoms: Secondary | ICD-10-CM | POA: Diagnosis not present

## 2012-08-25 DIAGNOSIS — E119 Type 2 diabetes mellitus without complications: Secondary | ICD-10-CM | POA: Diagnosis not present

## 2012-08-25 DIAGNOSIS — I2510 Atherosclerotic heart disease of native coronary artery without angina pectoris: Secondary | ICD-10-CM

## 2012-08-25 DIAGNOSIS — I10 Essential (primary) hypertension: Secondary | ICD-10-CM

## 2012-08-25 DIAGNOSIS — F411 Generalized anxiety disorder: Secondary | ICD-10-CM

## 2012-08-25 LAB — BASIC METABOLIC PANEL
Calcium: 10.2 mg/dL (ref 8.4–10.5)
Creatinine, Ser: 0.8 mg/dL (ref 0.4–1.5)
GFR: 105.49 mL/min (ref >60.00)
Glucose, Bld: 110 mg/dL — ABNORMAL HIGH (ref 70–99)
Sodium: 138 mEq/L (ref 135–145)

## 2012-08-25 LAB — TSH: TSH: 0.92 u[IU]/mL (ref 0.35–5.50)

## 2012-08-25 LAB — CBC WITH DIFFERENTIAL/PLATELET
Basophils Relative: 0.5 % (ref 0.0–3.0)
HCT: 47.3 % (ref 39.0–52.0)
Hemoglobin: 15.3 g/dL (ref 13.0–17.0)
Lymphocytes Relative: 28.2 % (ref 12.0–46.0)
Monocytes Relative: 9.1 % (ref 3.0–12.0)
Neutro Abs: 6.7 10*3/uL (ref 1.4–7.7)
RBC: 5.07 Mil/uL (ref 4.22–5.81)

## 2012-08-25 LAB — LIPID PANEL
HDL: 32.1 mg/dL — ABNORMAL LOW (ref >39.00)
Total CHOL/HDL Ratio: 4
Triglycerides: 87 mg/dL (ref 0.0–149.0)
VLDL: 17.4 mg/dL (ref 0.0–40.0)

## 2012-08-25 LAB — HEPATIC FUNCTION PANEL
AST: 18 U/L (ref 0–37)
Bilirubin, Direct: 0.2 mg/dL (ref 0.0–0.3)
Total Bilirubin: 0.5 mg/dL (ref 0.3–1.2)

## 2012-08-25 LAB — POCT URINALYSIS DIPSTICK
Bilirubin, UA: NEGATIVE
Ketones, UA: NEGATIVE
Leukocytes, UA: NEGATIVE
Spec Grav, UA: 1.025
pH, UA: 5.5

## 2012-08-25 LAB — PSA: PSA: 5.52 ng/mL — ABNORMAL HIGH (ref 0.10–4.00)

## 2012-08-25 NOTE — Progress Notes (Signed)
  Subjective:    Patient ID: [name], male    DOB: 1946/04/13, 66 y.o.   MRN: [id number]  HPI 66 yr old male for a cpx. He feels well in general. We saw him a few months ago for diarrhea which we felt was due to IBS. This has greatly improved. He is due to see his urologist next week.    Review of Systems  Constitutional: Negative.   HENT: Negative.   Eyes: Negative.   Respiratory: Negative.   Cardiovascular: Negative.   Gastrointestinal: Negative.   Genitourinary: Negative.   Musculoskeletal: Negative.   Skin: Negative.   Neurological: Negative.   Hematological: Negative.   Psychiatric/Behavioral: Negative.        Objective:   Physical Exam  Constitutional: He is oriented to person, place, and time. He appears well-developed and well-nourished. No distress.  HENT:  Head: Normocephalic and atraumatic.  Right Ear: External ear normal.  Left Ear: External ear normal.  Nose: Nose normal.  Mouth/Throat: Oropharynx is clear and moist. No oropharyngeal exudate.  Eyes: Conjunctivae normal and EOM are normal. Pupils are equal, round, and reactive to light. Right eye exhibits no discharge. Left eye exhibits no discharge. No scleral icterus.  Neck: Neck supple. No JVD present. No tracheal deviation present. No thyromegaly present.  Cardiovascular: Normal rate, regular rhythm, normal heart sounds and intact distal pulses.  Exam reveals no gallop and no friction rub.   No murmur heard.      EKG normal   Pulmonary/Chest: Effort normal and breath sounds normal. No respiratory distress. He has no wheezes. He has no rales. He exhibits no tenderness.  Abdominal: Soft. Bowel sounds are normal. He exhibits no distension and no mass. There is no tenderness. There is no rebound and no guarding.  Genitourinary: Rectum normal, prostate normal and penis normal. Guaiac negative stool. No penile tenderness.  Musculoskeletal: Normal range of motion. He exhibits no edema and no tenderness.    Lymphadenopathy:    He has no cervical adenopathy.  Neurological: He is alert and oriented to person, place, and time. He has normal reflexes. No cranial nerve deficit. He exhibits normal muscle tone. Coordination normal.  Skin: Skin is warm and dry. No rash noted. He is not diaphoretic. No erythema. No pallor.  Psychiatric: He has a normal mood and affect. His behavior is normal. Judgment and thought content normal.          Assessment & Plan:  Well exam. Get fasting labs.

## 2012-08-26 ENCOUNTER — Encounter: Payer: Self-pay | Admitting: Family Medicine

## 2012-08-26 NOTE — Progress Notes (Signed)
Quick Note:  I spoke with pt ______ 

## 2012-09-08 DIAGNOSIS — M6530 Trigger finger, unspecified finger: Secondary | ICD-10-CM | POA: Diagnosis not present

## 2012-11-25 ENCOUNTER — Ambulatory Visit (INDEPENDENT_AMBULATORY_CARE_PROVIDER_SITE_OTHER): Payer: Medicare Other | Admitting: Licensed Clinical Social Worker

## 2012-11-25 DIAGNOSIS — F331 Major depressive disorder, recurrent, moderate: Secondary | ICD-10-CM

## 2012-12-02 ENCOUNTER — Ambulatory Visit: Payer: Medicare Other | Admitting: Licensed Clinical Social Worker

## 2012-12-09 ENCOUNTER — Ambulatory Visit (INDEPENDENT_AMBULATORY_CARE_PROVIDER_SITE_OTHER): Payer: Medicare Other | Admitting: Licensed Clinical Social Worker

## 2012-12-09 DIAGNOSIS — F331 Major depressive disorder, recurrent, moderate: Secondary | ICD-10-CM

## 2012-12-20 ENCOUNTER — Other Ambulatory Visit: Payer: Self-pay

## 2013-01-23 ENCOUNTER — Ambulatory Visit (INDEPENDENT_AMBULATORY_CARE_PROVIDER_SITE_OTHER): Payer: Medicare Other | Admitting: Family Medicine

## 2013-01-23 ENCOUNTER — Encounter: Payer: Self-pay | Admitting: Family Medicine

## 2013-01-23 VITALS — BP 128/72 | HR 95 | Temp 97.9°F | Wt 280.0 lb

## 2013-01-23 DIAGNOSIS — K589 Irritable bowel syndrome without diarrhea: Secondary | ICD-10-CM | POA: Diagnosis not present

## 2013-01-23 DIAGNOSIS — N529 Male erectile dysfunction, unspecified: Secondary | ICD-10-CM

## 2013-01-23 MED ORDER — TADALAFIL 5 MG PO TABS: 5.0000 mg | ORAL_TABLET | Freq: Every day | ORAL | Status: DC

## 2013-01-23 NOTE — Progress Notes (Signed)
  Subjective:    Patient ID: [name], male    DOB: [birth year], 67 y.o.   MRN: [id number]  HPI Here to discuss his frequent diarrhea. I had suggested he try probiotics burt he has not done so. He tried Bentyl for awhile and it helped but he had to stop it because it aggravated his GERD. He has actually found that smoking marijuana helps the diarrhea tremendously but of course he does not want to rely on this method. He wants to try daily Cialis.    Review of Systems  Constitutional: Negative.   Gastrointestinal: Positive for diarrhea. Negative for nausea, vomiting, abdominal pain, constipation, blood in stool, abdominal distention and rectal pain.       Objective:   Physical Exam  Constitutional: He appears well-developed and well-nourished.  Abdominal: Soft. Bowel sounds are normal. He exhibits no distension and no mass. There is no tenderness. There is no rebound and no guarding.          Assessment & Plan:  I again urged him to try some OTC probiotics. Try Cialis 5 mg daily.

## 2013-01-24 ENCOUNTER — Other Ambulatory Visit: Payer: Self-pay | Admitting: Family Medicine

## 2013-01-26 DIAGNOSIS — R351 Nocturia: Secondary | ICD-10-CM | POA: Diagnosis not present

## 2013-01-26 DIAGNOSIS — E119 Type 2 diabetes mellitus without complications: Secondary | ICD-10-CM | POA: Diagnosis not present

## 2013-01-26 DIAGNOSIS — R81 Glycosuria: Secondary | ICD-10-CM | POA: Diagnosis not present

## 2013-01-26 DIAGNOSIS — R972 Elevated prostate specific antigen [PSA]: Secondary | ICD-10-CM | POA: Diagnosis not present

## 2013-01-28 ENCOUNTER — Ambulatory Visit (INDEPENDENT_AMBULATORY_CARE_PROVIDER_SITE_OTHER): Payer: Medicare Other | Admitting: Family Medicine

## 2013-01-28 ENCOUNTER — Encounter: Payer: Self-pay | Admitting: Family Medicine

## 2013-01-28 VITALS — BP 144/80 | HR 94 | Temp 98.2°F | Wt 281.0 lb

## 2013-01-28 DIAGNOSIS — B029 Zoster without complications: Secondary | ICD-10-CM | POA: Diagnosis not present

## 2013-01-28 DIAGNOSIS — E119 Type 2 diabetes mellitus without complications: Secondary | ICD-10-CM | POA: Diagnosis not present

## 2013-01-28 MED ORDER — METHYLPREDNISOLONE 4 MG PO KIT: PACK | ORAL | Status: AC

## 2013-01-28 MED ORDER — VALACYCLOVIR HCL 1 G PO TABS: 1000.0000 mg | ORAL_TABLET | Freq: Three times a day (TID) | ORAL | Status: DC

## 2013-01-28 NOTE — Progress Notes (Signed)
  Subjective:    Patient ID: [name], male    DOB: 1945-11-16, 67 y.o.   MRN: [id number]  HPI Here for 3 days of some itching and burning on the scalp and a few bumps that came up in the skin. Of note he recently saw is urologist who checked his A1c, and this returned at 11.0. This was 8.5 here last October. He admits to totally ignoring his diet the past few months and eating a lot of desserts.    Review of Systems  Constitutional: Negative.   Skin: Positive for rash.       Objective:   Physical Exam  Constitutional: He appears well-developed and well-nourished.  Lymphadenopathy:    He has no cervical adenopathy.  Skin:  There is an area of erythema with a few vesicles on the right parietal scalp and the right forehead          Assessment & Plan:  He has shingles so we will start Valtrex and prednisone. As for his diabetes we had a long discussion and he will get back on a strict low calorie diet for 3 months. At that time we will get another A1c.

## 2013-02-27 ENCOUNTER — Other Ambulatory Visit: Payer: Self-pay | Admitting: Family Medicine

## 2013-03-07 ENCOUNTER — Other Ambulatory Visit: Payer: Self-pay | Admitting: Family Medicine

## 2013-03-09 NOTE — Telephone Encounter (Signed)
Can we refill this? 

## 2013-03-31 ENCOUNTER — Other Ambulatory Visit: Payer: Self-pay | Admitting: Family Medicine

## 2013-04-20 ENCOUNTER — Encounter: Payer: Self-pay | Admitting: Family Medicine

## 2013-04-20 ENCOUNTER — Ambulatory Visit (INDEPENDENT_AMBULATORY_CARE_PROVIDER_SITE_OTHER): Payer: Medicare Other | Admitting: Family Medicine

## 2013-04-20 VITALS — BP 134/70 | HR 89 | Temp 99.3°F | Wt 275.0 lb

## 2013-04-20 DIAGNOSIS — E119 Type 2 diabetes mellitus without complications: Secondary | ICD-10-CM | POA: Diagnosis not present

## 2013-04-20 DIAGNOSIS — I2510 Atherosclerotic heart disease of native coronary artery without angina pectoris: Secondary | ICD-10-CM | POA: Diagnosis not present

## 2013-04-20 DIAGNOSIS — I10 Essential (primary) hypertension: Secondary | ICD-10-CM

## 2013-04-20 LAB — HEMOGLOBIN A1C: Hgb A1c MFr Bld: 11.9 % — ABNORMAL HIGH (ref 4.6–6.5)

## 2013-04-20 NOTE — Progress Notes (Signed)
  Subjective:    Patient ID: [name], male    DOB: 1945-12-25, 67 y.o.   MRN: [id number]  HPI Here for follow up. He has restricted his diet and he has lost 6 lbs. His BP is down. He feels better.    Review of Systems  Constitutional: Negative.   Respiratory: Negative.   Cardiovascular: Negative.        Objective:   Physical Exam  Constitutional: He appears well-developed and well-nourished.  Cardiovascular: Normal rate, regular rhythm, normal heart sounds and intact distal pulses.   Pulmonary/Chest: Effort normal and breath sounds normal.          Assessment & Plan:  Check an A1c today

## 2013-04-21 MED ORDER — GLIPIZIDE 10 MG PO TABS: 10.0000 mg | ORAL_TABLET | Freq: Two times a day (BID) | ORAL | Status: DC

## 2013-04-21 NOTE — Progress Notes (Signed)
Quick Note:  I spoke with pt and sent script for Glipizide e-scribe, also released results in my chart. ______

## 2013-04-21 NOTE — Addendum Note (Signed)
Addended by: Aniceto Boss A on: 04/21/2013 05:07 PM   Modules accepted: Orders

## 2013-06-01 ENCOUNTER — Telehealth: Payer: Self-pay | Admitting: Family Medicine

## 2013-06-01 MED ORDER — METFORMIN HCL 1000 MG PO TABS: 1000.0000 mg | ORAL_TABLET | Freq: Two times a day (BID) | ORAL | Status: DC

## 2013-06-01 NOTE — Telephone Encounter (Signed)
Refill request for Metformin 500 mg take 1 po bid and [facility].

## 2013-06-01 NOTE — Telephone Encounter (Signed)
Pt's dose has changed to Metformin 1000 mg take 1 po bid. I did speak with and also sent script e-scribe.

## 2013-06-02 ENCOUNTER — Ambulatory Visit (INDEPENDENT_AMBULATORY_CARE_PROVIDER_SITE_OTHER): Payer: Medicare Other | Admitting: Licensed Clinical Social Worker

## 2013-06-02 DIAGNOSIS — F331 Major depressive disorder, recurrent, moderate: Secondary | ICD-10-CM

## 2013-06-05 ENCOUNTER — Telehealth: Payer: Self-pay | Admitting: Family Medicine

## 2013-06-05 MED ORDER — LOSARTAN POTASSIUM-HCTZ 50-12.5 MG PO TABS: 1.0000 | ORAL_TABLET | Freq: Every day | ORAL | Status: DC

## 2013-06-05 NOTE — Telephone Encounter (Signed)
Refill request for Losartan- HCTZ and I did send e-scribe.

## 2013-06-09 ENCOUNTER — Ambulatory Visit (INDEPENDENT_AMBULATORY_CARE_PROVIDER_SITE_OTHER): Payer: Medicare Other | Admitting: Licensed Clinical Social Worker

## 2013-06-09 DIAGNOSIS — F331 Major depressive disorder, recurrent, moderate: Secondary | ICD-10-CM

## 2013-06-10 ENCOUNTER — Other Ambulatory Visit: Payer: Self-pay

## 2013-07-21 ENCOUNTER — Other Ambulatory Visit: Payer: Self-pay | Admitting: Family Medicine

## 2013-08-05 ENCOUNTER — Ambulatory Visit (INDEPENDENT_AMBULATORY_CARE_PROVIDER_SITE_OTHER): Payer: Medicare Other | Admitting: Licensed Clinical Social Worker

## 2013-08-05 DIAGNOSIS — F331 Major depressive disorder, recurrent, moderate: Secondary | ICD-10-CM

## 2013-08-12 ENCOUNTER — Ambulatory Visit (INDEPENDENT_AMBULATORY_CARE_PROVIDER_SITE_OTHER): Payer: Medicare Other | Admitting: Licensed Clinical Social Worker

## 2013-08-12 DIAGNOSIS — F331 Major depressive disorder, recurrent, moderate: Secondary | ICD-10-CM

## 2013-08-26 ENCOUNTER — Ambulatory Visit (INDEPENDENT_AMBULATORY_CARE_PROVIDER_SITE_OTHER): Payer: Medicare Other | Admitting: Family Medicine

## 2013-08-26 ENCOUNTER — Encounter: Payer: Self-pay | Admitting: Family Medicine

## 2013-08-26 VITALS — BP 124/80 | HR 85 | Temp 99.1°F | Ht 67.25 in | Wt 281.0 lb

## 2013-08-26 DIAGNOSIS — R972 Elevated prostate specific antigen [PSA]: Secondary | ICD-10-CM

## 2013-08-26 DIAGNOSIS — I10 Essential (primary) hypertension: Secondary | ICD-10-CM

## 2013-08-26 DIAGNOSIS — E119 Type 2 diabetes mellitus without complications: Secondary | ICD-10-CM

## 2013-08-26 DIAGNOSIS — Z23 Encounter for immunization: Secondary | ICD-10-CM | POA: Diagnosis not present

## 2013-08-26 DIAGNOSIS — N400 Enlarged prostate without lower urinary tract symptoms: Secondary | ICD-10-CM | POA: Diagnosis not present

## 2013-08-26 DIAGNOSIS — I2510 Atherosclerotic heart disease of native coronary artery without angina pectoris: Secondary | ICD-10-CM | POA: Diagnosis not present

## 2013-08-26 DIAGNOSIS — E785 Hyperlipidemia, unspecified: Secondary | ICD-10-CM

## 2013-08-26 LAB — POCT URINALYSIS DIPSTICK
Bilirubin, UA: NEGATIVE
Ketones, UA: NEGATIVE
Leukocytes, UA: NEGATIVE
Spec Grav, UA: >=1.03
pH, UA: 6

## 2013-08-26 LAB — HEPATIC FUNCTION PANEL
ALT: 27 U/L (ref 0–53)
AST: 19 U/L (ref 0–37)
Albumin: 4 g/dL (ref 3.5–5.2)
Alkaline Phosphatase: 59 U/L (ref 39–117)
Bilirubin, Direct: 0.1 mg/dL (ref 0.0–0.3)
Total Bilirubin: 0.7 mg/dL (ref 0.3–1.2)
Total Protein: 6.7 g/dL (ref 6.0–8.3)

## 2013-08-26 LAB — CBC WITH DIFFERENTIAL/PLATELET
Basophils Absolute: 0.1 10*3/uL (ref 0.0–0.1)
Basophils Relative: 0.6 % (ref 0.0–3.0)
HCT: 46.2 % (ref 39.0–52.0)
Hemoglobin: 15.4 g/dL (ref 13.0–17.0)
Lymphocytes Relative: 26.8 % (ref 12.0–46.0)
Lymphs Abs: 2.8 10*3/uL (ref 0.7–4.0)
MCHC: 33.3 g/dL (ref 30.0–36.0)
MCV: 91 fl (ref 78.0–100.0)
Monocytes Absolute: 1.1 10*3/uL — ABNORMAL HIGH (ref 0.1–1.0)
Monocytes Relative: 10.5 % (ref 3.0–12.0)
Neutro Abs: 6 10*3/uL (ref 1.4–7.7)
RDW: 14.1 % (ref 11.5–14.6)

## 2013-08-26 LAB — BASIC METABOLIC PANEL
CO2: 28 mEq/L (ref 19–32)
Chloride: 102 mEq/L (ref 96–112)
Glucose, Bld: 202 mg/dL — ABNORMAL HIGH (ref 70–99)
Sodium: 137 mEq/L (ref 135–145)

## 2013-08-26 LAB — TSH: TSH: 0.83 u[IU]/mL (ref 0.35–5.50)

## 2013-08-26 LAB — LIPID PANEL: Total CHOL/HDL Ratio: 4

## 2013-08-26 NOTE — Progress Notes (Signed)
  Subjective:    Patient ID: [name], male    DOB: Jul 10, 1946, 67 y.o.   MRN: [id number]  HPI 67 yr old male for a cpx. He feels well. He is pleased because his wife finally arrived here to live with him from the [place] last week. He still sees Dr. Wilson Singer regularly for prostate exams.    Review of Systems  Constitutional: Negative.   HENT: Negative.   Eyes: Negative.   Respiratory: Negative.   Cardiovascular: Negative.   Gastrointestinal: Negative.   Genitourinary: Negative.   Musculoskeletal: Negative.   Skin: Negative.   Neurological: Negative.   Psychiatric/Behavioral: Negative.        Objective:   Physical Exam  Constitutional: He is oriented to person, place, and time. He appears well-developed and well-nourished. No distress.  HENT:  Head: Normocephalic and atraumatic.  Right Ear: External ear normal.  Left Ear: External ear normal.  Nose: Nose normal.  Mouth/Throat: Oropharynx is clear and moist. No oropharyngeal exudate.  Eyes: Conjunctivae and EOM are normal. Pupils are equal, round, and reactive to light. Right eye exhibits no discharge. Left eye exhibits no discharge. No scleral icterus.  Neck: Neck supple. No JVD present. No tracheal deviation present. No thyromegaly present.  Cardiovascular: Normal rate, regular rhythm, normal heart sounds and intact distal pulses.  Exam reveals no gallop and no friction rub.   No murmur heard. EKG normal   Pulmonary/Chest: Effort normal and breath sounds normal. No respiratory distress. He has no wheezes. He has no rales. He exhibits no tenderness.  Abdominal: Soft. Bowel sounds are normal. He exhibits no distension and no mass. There is no tenderness. There is no rebound and no guarding.  Musculoskeletal: Normal range of motion. He exhibits no edema and no tenderness.  Lymphadenopathy:    He has no cervical adenopathy.  Neurological: He is alert and oriented to person, place, and time. He has normal reflexes. No  cranial nerve deficit. He exhibits normal muscle tone. Coordination normal.  Skin: Skin is warm and dry. No rash noted. He is not diaphoretic. No erythema. No pallor.  Psychiatric: He has a normal mood and affect. His behavior is normal. Judgment and thought content normal.          Assessment & Plan:  Well exam. Get fasting labs

## 2013-08-31 NOTE — Progress Notes (Signed)
Quick Note:  I released results in my chart and tried to reach pt by phone, no answer. ______

## 2013-09-10 ENCOUNTER — Other Ambulatory Visit: Payer: Self-pay

## 2013-09-17 ENCOUNTER — Other Ambulatory Visit: Payer: Self-pay | Admitting: Family Medicine

## 2013-09-24 ENCOUNTER — Ambulatory Visit (INDEPENDENT_AMBULATORY_CARE_PROVIDER_SITE_OTHER): Payer: Medicare Other | Admitting: Otolaryngology

## 2013-09-24 DIAGNOSIS — H903 Sensorineural hearing loss, bilateral: Secondary | ICD-10-CM

## 2013-10-11 ENCOUNTER — Other Ambulatory Visit: Payer: Self-pay | Admitting: Family Medicine

## 2013-12-08 ENCOUNTER — Ambulatory Visit (INDEPENDENT_AMBULATORY_CARE_PROVIDER_SITE_OTHER): Payer: Medicare Other | Admitting: Family Medicine

## 2013-12-08 ENCOUNTER — Encounter: Payer: Self-pay | Admitting: Family Medicine

## 2013-12-08 VITALS — BP 126/80 | HR 85 | Temp 98.2°F | Ht 67.25 in | Wt 277.0 lb

## 2013-12-08 DIAGNOSIS — E119 Type 2 diabetes mellitus without complications: Secondary | ICD-10-CM

## 2013-12-08 DIAGNOSIS — I10 Essential (primary) hypertension: Secondary | ICD-10-CM | POA: Diagnosis not present

## 2013-12-08 DIAGNOSIS — I2510 Atherosclerotic heart disease of native coronary artery without angina pectoris: Secondary | ICD-10-CM | POA: Diagnosis not present

## 2013-12-08 LAB — HEMOGLOBIN A1C: HEMOGLOBIN A1C: 12.9 % — AB (ref 4.6–6.5)

## 2013-12-08 NOTE — Progress Notes (Signed)
   Subjective:    Patient ID: [name], male    DOB: March 04, 1946, 68 y.o.   MRN: [id number]  HPI Here to follow up. He feels great. He brings his new wife, [name], with him to meet [place] today. He has watched a strict diet and he has stopped eating ice cream as promised. His A1c was 11.9 last June and 9.1 in October.    Review of Systems  Constitutional: Negative.   Respiratory: Negative.   Cardiovascular: Negative.        Objective:   Physical Exam  Constitutional: He appears well-developed and well-nourished.  Cardiovascular: Normal rate, regular rhythm, normal heart sounds and intact distal pulses.   Pulmonary/Chest: Effort normal and breath sounds normal.          Assessment & Plan:  Get an A1c today

## 2013-12-08 NOTE — Progress Notes (Signed)
Pre visit review using our clinic review tool, if applicable. No additional management support is needed unless otherwise documented below in the visit note. 

## 2013-12-09 ENCOUNTER — Telehealth: Payer: Self-pay | Admitting: Family Medicine

## 2013-12-09 NOTE — Telephone Encounter (Signed)
Relevant patient education assigned to patient using Emmi. ° °

## 2013-12-10 NOTE — Addendum Note (Signed)
Addended by: Alysia Penna A on: 12/10/2013 02:12 PM   Modules accepted: Orders

## 2013-12-16 DIAGNOSIS — H903 Sensorineural hearing loss, bilateral: Secondary | ICD-10-CM | POA: Diagnosis not present

## 2013-12-18 ENCOUNTER — Ambulatory Visit: Payer: Medicare Other | Admitting: Endocrinology

## 2014-01-25 ENCOUNTER — Telehealth: Payer: Self-pay | Admitting: Family Medicine

## 2014-01-25 NOTE — Telephone Encounter (Signed)
Pt request appt thurs or fri.  Pt hurt ankle a week ago and now there's a knot left there that will not go down. Pt had a large cabinet fall on his leg. Pt prefers to wait on Dr Sarajane Jews. pls advise

## 2014-01-27 NOTE — Telephone Encounter (Signed)
Yes, okay to schedule for either day.

## 2014-02-02 ENCOUNTER — Encounter: Payer: Self-pay | Admitting: Family Medicine

## 2014-02-02 ENCOUNTER — Ambulatory Visit (INDEPENDENT_AMBULATORY_CARE_PROVIDER_SITE_OTHER): Payer: Medicare Other | Admitting: Family Medicine

## 2014-02-02 VITALS — BP 120/72 | HR 78 | Temp 98.4°F | Ht 67.25 in | Wt 276.0 lb

## 2014-02-02 DIAGNOSIS — I2510 Atherosclerotic heart disease of native coronary artery without angina pectoris: Secondary | ICD-10-CM

## 2014-02-02 DIAGNOSIS — S8010XA Contusion of unspecified lower leg, initial encounter: Secondary | ICD-10-CM | POA: Diagnosis not present

## 2014-02-02 DIAGNOSIS — S8011XA Contusion of right lower leg, initial encounter: Secondary | ICD-10-CM

## 2014-02-02 NOTE — Progress Notes (Signed)
   Subjective:    Patient ID: [name], male    DOB: 1946-09-02, 68 y.o.   MRN: [id number]  HPI Here to check injuries sustained about 4 weeks ago when a 600 lb belt sander fell on his right leg. He was attempting to move this at home by himself and it tipped over onto the leg. He had large bruises with swelling on the knee and the ankle, and the areas are still swollen. They are slowly getting smaller however and they are no longer painful . He can walk on the leg with no pain.    Review of Systems  Constitutional: Negative.   Musculoskeletal: Positive for joint swelling.       Objective:   Physical Exam  Constitutional: He appears well-developed and well-nourished. No distress.  Musculoskeletal:  There is a small hematoma on the medial right knee with no tenderness or warmth. Full ROM. There is a larger hematoma on the medial right lower leg just superior to the malleolus. Again this is not warm or tender.           Assessment & Plan:  He has two hematomas from contusions but these look benign and they are slowly resolving. No treatment is needed. They should resolve over the next 4-8 weeks.

## 2014-02-02 NOTE — Progress Notes (Signed)
Pre visit review using our clinic review tool, if applicable. No additional management support is needed unless otherwise documented below in the visit note. 

## 2014-03-03 ENCOUNTER — Telehealth: Payer: Self-pay | Admitting: Family Medicine

## 2014-03-03 ENCOUNTER — Other Ambulatory Visit: Payer: Self-pay | Admitting: Family Medicine

## 2014-03-03 NOTE — Telephone Encounter (Signed)
error 

## 2014-05-03 ENCOUNTER — Other Ambulatory Visit: Payer: Self-pay | Admitting: Family Medicine

## 2014-06-09 ENCOUNTER — Ambulatory Visit (INDEPENDENT_AMBULATORY_CARE_PROVIDER_SITE_OTHER): Payer: Medicare Other | Admitting: Family Medicine

## 2014-06-09 ENCOUNTER — Encounter: Payer: Self-pay | Admitting: Family Medicine

## 2014-06-09 VITALS — BP 156/83 | HR 99 | Temp 98.7°F | Ht 67.25 in | Wt 284.0 lb

## 2014-06-09 DIAGNOSIS — E785 Hyperlipidemia, unspecified: Secondary | ICD-10-CM | POA: Diagnosis not present

## 2014-06-09 DIAGNOSIS — I2510 Atherosclerotic heart disease of native coronary artery without angina pectoris: Secondary | ICD-10-CM

## 2014-06-09 DIAGNOSIS — I10 Essential (primary) hypertension: Secondary | ICD-10-CM | POA: Diagnosis not present

## 2014-06-09 DIAGNOSIS — E119 Type 2 diabetes mellitus without complications: Secondary | ICD-10-CM

## 2014-06-09 NOTE — Progress Notes (Signed)
   Subjective:    Patient ID: [name], male    DOB: 11/07/1945, 68 y.o.   MRN: [id number]  HPI Here to follow up on diabetes and HTN. We last checked an A1c on him in February and it was quite high at 12.9 (this up from 9 last October). He was on Metformin and Glipizide then but he was very non-compliant. He ate a poor diet and never exercised. At that time we referred him to Endocrine but he cancelled the appt. He then decided he was "tired of taking pills" and he stopped all his meds about a month ago. He went for 2 weeks like this and felt awful with fatigue, lightheadedness, nausea, and HAs. He has now been back on the meds for 2 weeks and he feels better. He does not have a glucometer and he always declined this when I asked him about it. Now he realizes how serious his diabetes is and he wants to get a glucometer for home. He has made an introductory appt with Dr. Loanne Drilling for 06-29-14.    Review of Systems  Constitutional: Positive for fatigue.  Respiratory: Negative.   Cardiovascular: Negative.   Neurological: Positive for headaches. Negative for dizziness, tremors, seizures, syncope, facial asymmetry, speech difficulty, weakness, light-headedness and numbness.       Objective:   Physical Exam  Constitutional: He is oriented to person, place, and time. He appears well-developed and well-nourished.  Cardiovascular: Normal rate, regular rhythm, normal heart sounds and intact distal pulses.   Pulmonary/Chest: Effort normal and breath sounds normal.  Neurological: He is alert and oriented to person, place, and time.          Assessment & Plan:  He will stay on his current meds until he sees Dr. Loanne Drilling, and I feel sure he will switch him to insulin at that time. We will write for him to get a glucometer and testing supplies.

## 2014-06-09 NOTE — Progress Notes (Signed)
Pre visit review using our clinic review tool, if applicable. No additional management support is needed unless otherwise documented below in the visit note. 

## 2014-06-14 ENCOUNTER — Telehealth: Payer: Self-pay | Admitting: Family Medicine

## 2014-06-14 NOTE — Telephone Encounter (Signed)
[id number] - [place], [place] - [address] is requesting re-fill on losartan-hydrochlorothiazide (HYZAAR) 50-12.5 MG per tablet

## 2014-06-15 MED ORDER — LOSARTAN POTASSIUM-HCTZ 50-12.5 MG PO TABS: 1.0000 | ORAL_TABLET | Freq: Every day | ORAL | Status: DC

## 2014-06-15 NOTE — Telephone Encounter (Signed)
I sent script e-scribe. 

## 2014-06-29 ENCOUNTER — Encounter: Payer: Self-pay | Admitting: Endocrinology

## 2014-06-29 ENCOUNTER — Ambulatory Visit (INDEPENDENT_AMBULATORY_CARE_PROVIDER_SITE_OTHER): Payer: Medicare Other | Admitting: Endocrinology

## 2014-06-29 ENCOUNTER — Encounter: Payer: Medicare Other | Attending: Endocrinology | Admitting: Nutrition

## 2014-06-29 VITALS — BP 122/84 | HR 91 | Temp 98.0°F | Ht 67.25 in | Wt 281.0 lb

## 2014-06-29 DIAGNOSIS — Z713 Dietary counseling and surveillance: Secondary | ICD-10-CM | POA: Diagnosis not present

## 2014-06-29 DIAGNOSIS — I2510 Atherosclerotic heart disease of native coronary artery without angina pectoris: Secondary | ICD-10-CM | POA: Diagnosis not present

## 2014-06-29 DIAGNOSIS — E119 Type 2 diabetes mellitus without complications: Secondary | ICD-10-CM | POA: Diagnosis not present

## 2014-06-29 DIAGNOSIS — Z794 Long term (current) use of insulin: Secondary | ICD-10-CM | POA: Insufficient documentation

## 2014-06-29 LAB — MICROALBUMIN / CREATININE URINE RATIO
Creatinine,U: 44.1 mg/dL
MICROALB/CREAT RATIO: 20.6 mg/g (ref 0.0–30.0)
Microalb, Ur: 9.1 mg/dL — ABNORMAL HIGH (ref 0.0–1.9)

## 2014-06-29 MED ORDER — INSULIN DETEMIR 100 UNIT/ML FLEXPEN: 40.0000 [IU] | PEN_INJECTOR | SUBCUTANEOUS | Status: DC

## 2014-06-29 NOTE — Patient Instructions (Addendum)
good diet and exercise habits significanly improve the control of your diabetes.  please let me know if you wish to be referred to a dietician.  high blood sugar is very risky to your health.  you should see an eye doctor and dentist every year.  You are at higher than average risk for pneumonia and hepatitis-B.  You should be vaccinated against both.   controlling your blood pressure and cholesterol drastically reduces the damage diabetes does to your body.  this also applies to quitting smoking.  please discuss these with your doctor.  check your blood sugar twice a day.  vary the time of day when you check, between before the 3 meals, and at bedtime.  also check if you have symptoms of your blood sugar being too high or too low.  please keep a record of the readings and bring it to your next appointment here.  You can write it on any piece of paper.  please call us sooner if your blood sugar goes below 70, or if you have a lot of readings over 200. Please start 40 units of insulin each morning. Please come back for a follow-up appointment in 2 weeks

## 2014-06-29 NOTE — Progress Notes (Signed)
Subjective:    Patient ID: [name], male    DOB: Jul 03, 1946, 68 y.o.   MRN: [id number]  HPI pt states DM was dx'ed in 0093; complicated by mild neuropathy of the lower extremities, and assoc CAD; he has never been on insulin; pt says his diet and exercise are poor; he has never had pancreatitis, severe hypoglycemia or DKA.  He says cbg's are the 200's.   Past Medical History  Diagnosis Date  . GERD (gastroesophageal reflux disease)   . Hyperlipidemia   . Hypertension   . CAD (coronary artery disease)   . Allergy   . Depression   . BPH (benign prostatic hyperplasia)     sees Dr. Peterson Lombard in [facility]   . Insomnia   . Hip pain, bilateral     sees Dr. Alvan Dame   . Diabetes mellitus   . Hypogonadism male   . Elevated PSA     sees Dr. Peterson Lombard in [facility]    Past Surgical History  Procedure Laterality Date  . Cholecystectomy    . Transurethral resection of prostate  January 2010    per Dr. Ky Barban  . Hernia repair      umbilical  . Colonoscopy  09-14-08    per Dr. Deatra Ina, repeat in 10 yrs     History   Social History  . Marital Status: Single    Spouse Name: N/A    Number of Children: N/A  . Years of Education: N/A   Occupational History  . Not on file.   Social History Main Topics  . Smoking status: Former Research scientist (life sciences)  . Smokeless tobacco: Never Used  . Alcohol Use: No  . Drug Use: No  . Sexual Activity: Not on file   Other Topics Concern  . Not on file   Social History Narrative  . No narrative on file    Current Outpatient Prescriptions on File Prior to Visit  Medication Sig Dispense Refill  . atorvastatin (LIPITOR) 80 MG tablet TAKE 1 TABLET BY MOUTH DAILY  90 tablet  0  . CIALIS 5 MG tablet TAKE 1 TABLET BY MOUTH DAILY  30 tablet  11  . FLUoxetine (PROZAC) 40 MG capsule TAKE 2 CAPSULES BY MOUTH DAILY  60 capsule  10  . glipiZIDE (GLUCOTROL) 10 MG tablet Take 1 tablet (10 mg total) by mouth 2 (two) times daily before a meal.  180 tablet  3  .  losartan-hydrochlorothiazide (HYZAAR) 50-12.5 MG per tablet Take 1 tablet by mouth daily.  30 tablet  3  . metFORMIN (GLUCOPHAGE) 1000 MG tablet Take 1 tablet (1,000 mg total) by mouth 2 (two) times daily with a meal.  60 tablet  11  . omeprazole (PRILOSEC) 20 MG capsule TAKE ONE CAPSULE BY MOUTH EVERY DAY  90 capsule  1  . [DISCONTINUED] lisinopril-hydrochlorothiazide (PRINZIDE,ZESTORETIC) 20-25 MG per tablet Take 1 tablet by mouth daily.  30 tablet  11   No current facility-administered medications on file prior to visit.    Allergies  Allergen Reactions  . Codeine Phosphate     REACTION: vomiting    Family History  Problem Relation Age of Onset  . Depression    . Diabetes Neg Hx     BP 122/84  Pulse 91  Temp(Src) 98 F (36.7 C) (Oral)  Ht 5' 7.25" (1.708 m)  Wt 281 lb (127.461 kg)  BMI 43.69 kg/m2  SpO2 91%     Review of Systems denies weight loss, blurry vision,  chest pain, n/v, excessive diaphoresis, cold intolerance, rhinorrhea, and easy bruising.  He has intermittent headache, leg cramps, depression, nocturia, and doe.       Objective:   Physical Exam VS: see vs page GEN: no distress HEAD: head: no deformity eyes: no periorbital swelling, no proptosis external nose and ears are normal mouth: no lesion seen NECK: supple, thyroid is not enlarged CHEST WALL: no deformity LUNGS: clear to auscultation BREASTS:  No gynecomastia CV: reg rate and rhythm, no murmur ABD: abdomen is soft, nontender.  no hepatosplenomegaly.  not distended.  no hernia MUSCULOSKELETAL: muscle bulk and strength are grossly normal.  no obvious joint swelling.  gait is normal and steady EXTEMITIES: no deformity.  no ulcer on the feet.  feet are of normal color and temp.  1+ bilat leg edema PULSES: dorsalis pedis intact bilat.  no carotid bruit NEURO:  cn 2-12 grossly intact.   readily moves all 4's.  sensation is intact to touch on the feet. But slightly decreased from normal SKIN:  Normal  texture and temperature.  No rash or suspicious lesion is visible.   NODES:  None palpable at the neck PSYCH: alert, well-oriented.  Does not appear anxious nor depressed.   Lab Results  Component Value Date   HGBA1C 12.9* 12/08/2013   i reviewed electrocardiogram (08/26/13)  I discussed with Leonia Reader, RN  i have reviewed the following outside records: Office notes    Assessment & Plan:  DM: severe exacerbation.  i advised insulin.  He agrees, but agrees to take only qd.   Morbid obesity, new to me: surgery is advised.  He declines.   Patient is advised the following: Patient Instructions  good diet and exercise habits significanly improve the control of your diabetes.  please let me know if you wish to be referred to a dietician.  high blood sugar is very risky to your health.  you should see an eye doctor and dentist every year.  You are at higher than average risk for pneumonia and hepatitis-B.  You should be vaccinated against both.   controlling your blood pressure and cholesterol drastically reduces the damage diabetes does to your body.  this also applies to quitting smoking.  please discuss these with your doctor.  check your blood sugar twice a day.  vary the time of day when you check, between before the 3 meals, and at bedtime.  also check if you have symptoms of your blood sugar being too high or too low.  please keep a record of the readings and bring it to your next appointment here.  You can write it on any piece of paper.  please call us sooner if your blood sugar goes below 70, or if you have a lot of readings over 200. Please start 40 units of insulin each morning. Please come back for a follow-up appointment in 2 weeks

## 2014-06-29 NOTE — Progress Notes (Signed)
This patient is here to start on Levemir.  We discussed the timing and how to take the insulin and he reported good understanding of this.  He was given a Levemir starter kit with pen directions and written directions were given for 40u to be given each morning.  He reverbalized the dose and had no final questions.   We also discussed low blood sugars-symptoms and treatment.  He reported good understanding of this as well.    He requested more information on diabetes, and so we discussed the idea of insulin resistance, and the need for exercise, wt. Loss and limiting the amounts of carbs, protein and fat at each meal.  He reported good understanding of this, and said he was going to start back walking and work up to 40 min. 5 days/wk., like he used to do.  I suggested that he start slow and work up to this over several weeks.   His meals are high fat and he is drinking whole milk, and snacking on a lot of nuts throughout the day.  I suggested that he reduce his milk to 2% and stop the snacking between meals.  He agreed to do this.   He was instructed to test his blood sugars q AM and one other time during the day.  Either before a meal, or at bedtime. Goals for blood sugars are to be less than 120.   He agreed to do this.  I will call him tomorrow to see how he did with his first injection.  He had no final questions.

## 2014-06-29 NOTE — Patient Instructions (Signed)
Take 40u of Levermir every morning. Test blood sugars 1-2 times/day--before breakfast and one other time.   Start back walking, and work up to 40 min. 4-5 days/wk.

## 2014-07-13 ENCOUNTER — Ambulatory Visit: Payer: PRIVATE HEALTH INSURANCE | Admitting: Endocrinology

## 2014-07-23 ENCOUNTER — Telehealth: Payer: Self-pay | Admitting: Family Medicine

## 2014-07-23 NOTE — Telephone Encounter (Signed)
I spoke with pt and he has not seen a eye doctor in 25 years. He said that he would call and schedule a visit soon.

## 2014-07-27 ENCOUNTER — Ambulatory Visit: Payer: PRIVATE HEALTH INSURANCE | Admitting: Endocrinology

## 2014-07-30 ENCOUNTER — Ambulatory Visit: Payer: PRIVATE HEALTH INSURANCE | Admitting: Endocrinology

## 2014-08-03 ENCOUNTER — Ambulatory Visit (INDEPENDENT_AMBULATORY_CARE_PROVIDER_SITE_OTHER): Payer: Medicare Other | Admitting: Licensed Clinical Social Worker

## 2014-08-03 DIAGNOSIS — F331 Major depressive disorder, recurrent, moderate: Secondary | ICD-10-CM | POA: Diagnosis not present

## 2014-08-10 ENCOUNTER — Ambulatory Visit (INDEPENDENT_AMBULATORY_CARE_PROVIDER_SITE_OTHER): Payer: Medicare Other | Admitting: Licensed Clinical Social Worker

## 2014-08-10 DIAGNOSIS — F332 Major depressive disorder, recurrent severe without psychotic features: Secondary | ICD-10-CM | POA: Diagnosis not present

## 2014-08-18 ENCOUNTER — Ambulatory Visit (INDEPENDENT_AMBULATORY_CARE_PROVIDER_SITE_OTHER): Payer: Medicare Other | Admitting: Licensed Clinical Social Worker

## 2014-08-18 DIAGNOSIS — F331 Major depressive disorder, recurrent, moderate: Secondary | ICD-10-CM | POA: Diagnosis not present

## 2014-08-20 ENCOUNTER — Other Ambulatory Visit: Payer: Self-pay

## 2014-08-31 ENCOUNTER — Encounter: Payer: Self-pay | Admitting: Family Medicine

## 2014-08-31 ENCOUNTER — Ambulatory Visit (INDEPENDENT_AMBULATORY_CARE_PROVIDER_SITE_OTHER): Payer: Medicare Other | Admitting: Family Medicine

## 2014-08-31 VITALS — BP 134/91 | HR 83 | Temp 98.2°F | Ht 67.25 in | Wt 268.0 lb

## 2014-08-31 DIAGNOSIS — K219 Gastro-esophageal reflux disease without esophagitis: Secondary | ICD-10-CM

## 2014-08-31 DIAGNOSIS — F32A Depression, unspecified: Secondary | ICD-10-CM

## 2014-08-31 DIAGNOSIS — I2510 Atherosclerotic heart disease of native coronary artery without angina pectoris: Secondary | ICD-10-CM | POA: Diagnosis not present

## 2014-08-31 DIAGNOSIS — E291 Testicular hypofunction: Secondary | ICD-10-CM

## 2014-08-31 DIAGNOSIS — E785 Hyperlipidemia, unspecified: Secondary | ICD-10-CM

## 2014-08-31 DIAGNOSIS — E119 Type 2 diabetes mellitus without complications: Secondary | ICD-10-CM

## 2014-08-31 DIAGNOSIS — N401 Enlarged prostate with lower urinary tract symptoms: Secondary | ICD-10-CM

## 2014-08-31 DIAGNOSIS — M069 Rheumatoid arthritis, unspecified: Secondary | ICD-10-CM | POA: Diagnosis not present

## 2014-08-31 DIAGNOSIS — F329 Major depressive disorder, single episode, unspecified: Secondary | ICD-10-CM

## 2014-08-31 DIAGNOSIS — N138 Other obstructive and reflux uropathy: Secondary | ICD-10-CM

## 2014-08-31 DIAGNOSIS — I10 Essential (primary) hypertension: Secondary | ICD-10-CM

## 2014-08-31 LAB — CBC WITH DIFFERENTIAL/PLATELET
Basophils Absolute: 0.1 10*3/uL (ref 0.0–0.1)
Basophils Relative: 0.5 % (ref 0.0–3.0)
EOS PCT: 2.7 % (ref 0.0–5.0)
Eosinophils Absolute: 0.3 10*3/uL (ref 0.0–0.7)
HEMATOCRIT: 53.6 % — AB (ref 39.0–52.0)
Hemoglobin: 17.3 g/dL — ABNORMAL HIGH (ref 13.0–17.0)
Lymphocytes Relative: 24.8 % (ref 12.0–46.0)
Lymphs Abs: 2.6 10*3/uL (ref 0.7–4.0)
MCHC: 32.3 g/dL (ref 30.0–36.0)
MCV: 91.4 fl (ref 78.0–100.0)
MONO ABS: 1 10*3/uL (ref 0.1–1.0)
Monocytes Relative: 9.4 % (ref 3.0–12.0)
NEUTROS PCT: 62.6 % (ref 43.0–77.0)
Neutro Abs: 6.7 10*3/uL (ref 1.4–7.7)
PLATELETS: 282 10*3/uL (ref 150.0–400.0)
RBC: 5.87 Mil/uL — ABNORMAL HIGH (ref 4.22–5.81)
RDW: 14.4 % (ref 11.5–15.5)
WBC: 10.6 10*3/uL — AB (ref 4.0–10.5)

## 2014-08-31 LAB — HEMOGLOBIN A1C: HEMOGLOBIN A1C: 11.8 % — AB (ref 4.6–6.5)

## 2014-08-31 LAB — HEPATIC FUNCTION PANEL
ALT: 23 U/L (ref 0–53)
AST: 18 U/L (ref 0–37)
Albumin: 3.7 g/dL (ref 3.5–5.2)
Alkaline Phosphatase: 73 U/L (ref 39–117)
BILIRUBIN TOTAL: 0.7 mg/dL (ref 0.2–1.2)
Bilirubin, Direct: 0.1 mg/dL (ref 0.0–0.3)
TOTAL PROTEIN: 7.2 g/dL (ref 6.0–8.3)

## 2014-08-31 LAB — LIPID PANEL
Cholesterol: 243 mg/dL — ABNORMAL HIGH (ref 0–200)
HDL: 34.9 mg/dL — ABNORMAL LOW (ref >39.00)
LDL Cholesterol: 177 mg/dL — ABNORMAL HIGH (ref 0–99)
NonHDL: 208.1
TRIGLYCERIDES: 157 mg/dL — AB (ref 0.0–149.0)
Total CHOL/HDL Ratio: 7
VLDL: 31.4 mg/dL (ref 0.0–40.0)

## 2014-08-31 LAB — BASIC METABOLIC PANEL
BUN: 10 mg/dL (ref 6–23)
CHLORIDE: 104 meq/L (ref 96–112)
CO2: 24 meq/L (ref 19–32)
Calcium: 10.6 mg/dL — ABNORMAL HIGH (ref 8.4–10.5)
Creatinine, Ser: 0.8 mg/dL (ref 0.4–1.5)
GFR: 103.33 mL/min (ref >60.00)
Glucose, Bld: 195 mg/dL — ABNORMAL HIGH (ref 70–99)
Potassium: 4.7 mEq/L (ref 3.5–5.1)
Sodium: 137 mEq/L (ref 135–145)

## 2014-08-31 LAB — MICROALBUMIN / CREATININE URINE RATIO
Creatinine,U: 295.9 mg/dL
MICROALB UR: 64.3 mg/dL — AB (ref 0.0–1.9)
Microalb Creat Ratio: 21.7 mg/g (ref 0.0–30.0)

## 2014-08-31 LAB — TSH: TSH: 1.05 u[IU]/mL (ref 0.35–4.50)

## 2014-08-31 LAB — PSA: PSA: 5.85 ng/mL — ABNORMAL HIGH (ref 0.10–4.00)

## 2014-08-31 MED ORDER — LOSARTAN POTASSIUM-HCTZ 50-12.5 MG PO TABS: 1.0000 | ORAL_TABLET | Freq: Every day | ORAL | Status: DC

## 2014-08-31 MED ORDER — FLUOXETINE HCL 40 MG PO CAPS: ORAL_CAPSULE | ORAL | Status: DC

## 2014-08-31 NOTE — Progress Notes (Signed)
Pre visit review using our clinic review tool, if applicable. No additional management support is needed unless otherwise documented below in the visit note. 

## 2014-08-31 NOTE — Progress Notes (Signed)
   Subjective:    Patient ID: [name], male    DOB: 1946/09/25, 68 y.o.   MRN: [id number]  HPI 68 yr old male for a cpx. He feels well physically but his anxiety and depression has been bothering him again. He took himself off Prozac about 6 months ago but he thinks he should get back on this again. He is struggling with family issues and with difficult relationships with his daughters. He sees Dr. Loanne Drilling for diabetes and is due to see him in a few weeks.    Review of Systems  Constitutional: Negative.   HENT: Negative.   Eyes: Negative.   Respiratory: Negative.   Cardiovascular: Negative.   Gastrointestinal: Negative.   Genitourinary: Negative.   Musculoskeletal: Negative.   Skin: Negative.   Neurological: Negative.   Psychiatric/Behavioral: Positive for dysphoric mood. Negative for suicidal ideas, hallucinations, behavioral problems, confusion, self-injury, decreased concentration and agitation. The patient is nervous/anxious.        Objective:   Physical Exam  Constitutional: He is oriented to person, place, and time. No distress.  Overweight   HENT:  Head: Normocephalic and atraumatic.  Right Ear: External ear normal.  Left Ear: External ear normal.  Nose: Nose normal.  Mouth/Throat: Oropharynx is clear and moist. No oropharyngeal exudate.  Eyes: Conjunctivae and EOM are normal. Pupils are equal, round, and reactive to light. Right eye exhibits no discharge. Left eye exhibits no discharge. No scleral icterus.  Neck: Neck supple. No JVD present. No tracheal deviation present. No thyromegaly present.  Cardiovascular: Normal rate, regular rhythm, normal heart sounds and intact distal pulses.  Exam reveals no gallop and no friction rub.   No murmur heard. EKG normal   Pulmonary/Chest: Effort normal and breath sounds normal. No respiratory distress. He has no wheezes. He has no rales. He exhibits no tenderness.  Abdominal: Soft. Bowel sounds are normal. He exhibits no  distension and no mass. There is no tenderness. There is no rebound and no guarding.  Musculoskeletal: Normal range of motion. He exhibits no edema and no tenderness.  Lymphadenopathy:    He has no cervical adenopathy.  Neurological: He is alert and oriented to person, place, and time. He has normal reflexes. No cranial nerve deficit. He exhibits normal muscle tone. Coordination normal.  Skin: Skin is warm and dry. No rash noted. He is not diaphoretic. No erythema. No pallor.  Psychiatric: He has a normal mood and affect. His behavior is normal. Judgment and thought content normal.          Assessment & Plan:  Well exam. Get back on Prozac by taking 40 mg daily for one week and increasing this to 80 mg daily. Get fasting labs

## 2014-09-06 ENCOUNTER — Ambulatory Visit (INDEPENDENT_AMBULATORY_CARE_PROVIDER_SITE_OTHER): Payer: Medicare Other | Admitting: Endocrinology

## 2014-09-06 ENCOUNTER — Encounter: Payer: Self-pay | Admitting: Endocrinology

## 2014-09-06 VITALS — BP 144/98 | HR 74 | Temp 98.9°F | Ht 67.5 in | Wt 271.0 lb

## 2014-09-06 DIAGNOSIS — Z23 Encounter for immunization: Secondary | ICD-10-CM | POA: Diagnosis not present

## 2014-09-06 DIAGNOSIS — I2510 Atherosclerotic heart disease of native coronary artery without angina pectoris: Secondary | ICD-10-CM

## 2014-09-06 DIAGNOSIS — E1159 Type 2 diabetes mellitus with other circulatory complications: Secondary | ICD-10-CM

## 2014-09-06 DIAGNOSIS — E119 Type 2 diabetes mellitus without complications: Secondary | ICD-10-CM | POA: Insufficient documentation

## 2014-09-06 MED ORDER — INSULIN DETEMIR 100 UNIT/ML FLEXPEN: 60.0000 [IU] | PEN_INJECTOR | SUBCUTANEOUS | Status: DC

## 2014-09-06 NOTE — Progress Notes (Signed)
Subjective:    Patient ID: [name], male    DOB: 07/31/1946, 68 y.o.   MRN: [id number]  HPI  Pt returns for f/u of diabetes mellitus: DM type: Insulin-requiring type 2 Dx'ed: 0488 Complications: polyneuropathy and CAD Therapy: insulin since 2015 DKA: never Severe hypoglycemia: never Pancreatitis: never Other: he agrees to take insulin only qd Interval history: pt says he never misses the insulin.  no cbg record, but states cbg's varies from 160-400 in am (the only time he checks).   Pt says he often forgets his BP meds Past Medical History  Diagnosis Date  . GERD (gastroesophageal reflux disease)   . Hyperlipidemia   . Hypertension   . CAD (coronary artery disease)   . Allergy   . Depression   . BPH (benign prostatic hyperplasia)     sees Dr. Peterson Lombard in [facility]   . Insomnia   . Hip pain, bilateral     sees Dr. Alvan Dame   . Diabetes mellitus   . Hypogonadism male   . Elevated PSA     sees Dr. Peterson Lombard in [facility]    Past Surgical History  Procedure Laterality Date  . Cholecystectomy    . Transurethral resection of prostate  January 2010    per Dr. Ky Barban  . Hernia repair      umbilical  . Colonoscopy  09-14-08    per Dr. Deatra Ina, repeat in 10 yrs     History   Social History  . Marital Status: Single    Spouse Name: N/A    Number of Children: N/A  . Years of Education: N/A   Occupational History  . Not on file.   Social History Main Topics  . Smoking status: Former Research scientist (life sciences)  . Smokeless tobacco: Never Used  . Alcohol Use: No  . Drug Use: No  . Sexual Activity: Not on file   Other Topics Concern  . Not on file   Social History Narrative    Current Outpatient Prescriptions on File Prior to Visit  Medication Sig Dispense Refill  . atorvastatin (LIPITOR) 80 MG tablet TAKE 1 TABLET BY MOUTH DAILY 90 tablet 0  . CIALIS 5 MG tablet TAKE 1 TABLET BY MOUTH DAILY 30 tablet 11  . FLUoxetine (PROZAC) 40 MG capsule TAKE 2 CAPSULES BY MOUTH DAILY  180 capsule 3  . losartan-hydrochlorothiazide (HYZAAR) 50-12.5 MG per tablet Take 1 tablet by mouth daily. 30 tablet 3  . omeprazole (PRILOSEC) 20 MG capsule TAKE ONE CAPSULE BY MOUTH EVERY DAY 90 capsule 1  . [DISCONTINUED] lisinopril-hydrochlorothiazide (PRINZIDE,ZESTORETIC) 20-25 MG per tablet Take 1 tablet by mouth daily. 30 tablet 11   No current facility-administered medications on file prior to visit.    Allergies  Allergen Reactions  . Codeine Phosphate     REACTION: vomiting    Family History  Problem Relation Age of Onset  . Depression    . Diabetes Neg Hx     BP 144/98 mmHg  Pulse 74  Temp(Src) 98.9 F (37.2 C) (Oral)  Ht 5' 7.5" (1.715 m)  Wt 271 lb (122.925 kg)  BMI 41.79 kg/m2  SpO2 93%   Review of Systems He denies hypoglycemia and n/v.      Objective:   Physical Exam VITAL SIGNS:  See vs page GENERAL: no distress EXTEMITIES: no deformity. 1+ bilat leg edema PULSES: dorsalis pedis intact bilat. NEURO: sensation is intact to touch on the feet. But slightly decreased from normal.   SKIN: no  ulcer on the feet. feet are of normal color and temp.     Lab Results  Component Value Date   HGBA1C 11.8* 08/31/2014      Assessment & Plan:  DM: moderate exacerbation. HTN: worse. Noncompliance with cbg recording and BP meds: I'll work around this as best I can.    Patient is advised the following: Patient Instructions  Please have your blood pressure rechecked soon, at Dr Barbie Banner office.   check your blood sugar twice a day.  vary the time of day when you check, between before the 3 meals, and at bedtime.  also check if you have symptoms of your blood sugar being too high or too low.  please keep a record of the readings and bring it to your next appointment here.  You can write it on any piece of paper.  please call us sooner if your blood sugar goes below 70, or if you have a lot of readings over 200. Please increase the insulin to 60 each morning.     Please come back for a follow-up appointment in 6 weeks.

## 2014-09-06 NOTE — Patient Instructions (Addendum)
Please have your blood pressure rechecked soon, at Dr Barbie Banner office.   check your blood sugar twice a day.  vary the time of day when you check, between before the 3 meals, and at bedtime.  also check if you have symptoms of your blood sugar being too high or too low.  please keep a record of the readings and bring it to your next appointment here.  You can write it on any piece of paper.  please call us sooner if your blood sugar goes below 70, or if you have a lot of readings over 200. Please increase the insulin to 60 each morning.   Please come back for a follow-up appointment in 6 weeks.

## 2014-09-07 ENCOUNTER — Ambulatory Visit: Payer: PRIVATE HEALTH INSURANCE | Admitting: Licensed Clinical Social Worker

## 2014-10-02 ENCOUNTER — Telehealth: Payer: Self-pay | Admitting: Family Medicine

## 2014-10-04 NOTE — Telephone Encounter (Signed)
I spoke with pt and sent both scripts e-scribe.

## 2014-10-04 NOTE — Telephone Encounter (Signed)
Pt following up on refill request, bc he is out. [facility] [place]  Metformin and glipizide

## 2014-10-18 ENCOUNTER — Ambulatory Visit: Payer: PRIVATE HEALTH INSURANCE | Admitting: Endocrinology

## 2014-11-12 ENCOUNTER — Encounter: Payer: Self-pay | Admitting: Family Medicine

## 2014-11-12 ENCOUNTER — Ambulatory Visit (INDEPENDENT_AMBULATORY_CARE_PROVIDER_SITE_OTHER): Payer: Medicare Other | Admitting: Family Medicine

## 2014-11-12 VITALS — BP 142/87 | HR 68 | Ht 67.5 in | Wt 271.0 lb

## 2014-11-12 DIAGNOSIS — L409 Psoriasis, unspecified: Secondary | ICD-10-CM | POA: Diagnosis not present

## 2014-11-12 NOTE — Progress Notes (Signed)
Pre visit review using our clinic review tool, if applicable. No additional management support is needed unless otherwise documented below in the visit note. 

## 2014-11-12 NOTE — Progress Notes (Signed)
   Subjective:    Patient ID: [name], male    DOB: December 14, 1945, 69 y.o.   MRN: [id number]  HPI Here to ask about some "bumps" that came up on his scalp about a month ago. They are not painful or itchy.    Review of Systems  Constitutional: Negative.        Objective:   Physical Exam  Constitutional: He appears well-developed and well-nourished.  Skin:  His scalp has several plaques of scaly skin, silver in color           Assessment & Plan:  This is psoriasis. Suggested he try Neutragena T Gel shampoo. Follow up prn

## 2014-11-16 ENCOUNTER — Ambulatory Visit: Payer: PRIVATE HEALTH INSURANCE | Admitting: Endocrinology

## 2014-11-23 ENCOUNTER — Telehealth: Payer: Self-pay | Admitting: Family Medicine

## 2014-11-23 MED ORDER — ATORVASTATIN CALCIUM 80 MG PO TABS: 80.0000 mg | ORAL_TABLET | Freq: Every day | ORAL | Status: DC

## 2014-11-23 NOTE — Telephone Encounter (Signed)
Refill request for Atorvastatin 80 mg take 1 po qd [facility]. I did send script e-scribe.

## 2014-12-10 ENCOUNTER — Ambulatory Visit (HOSPITAL_BASED_OUTPATIENT_CLINIC_OR_DEPARTMENT_OTHER): Admit: 2014-12-10 | Payer: Self-pay | Admitting: General Surgery

## 2014-12-10 ENCOUNTER — Encounter (HOSPITAL_BASED_OUTPATIENT_CLINIC_OR_DEPARTMENT_OTHER): Payer: Self-pay

## 2014-12-10 SURGERY — CYST REMOVAL TRUNK
Anesthesia: General | Laterality: Left

## 2014-12-13 ENCOUNTER — Telehealth: Payer: Self-pay | Admitting: Family Medicine

## 2014-12-13 ENCOUNTER — Other Ambulatory Visit: Payer: Self-pay | Admitting: Family Medicine

## 2014-12-13 NOTE — Telephone Encounter (Signed)
Pt call want Sunday Spillers to give he a call about a matter didn't say what the matter was about. The # is [phone].

## 2014-12-13 NOTE — Telephone Encounter (Signed)
Refill request for Glipizide & Metformin.

## 2014-12-14 MED ORDER — METFORMIN HCL 1000 MG PO TABS: ORAL_TABLET | ORAL | Status: DC

## 2014-12-14 MED ORDER — GLIPIZIDE 10 MG PO TABS: ORAL_TABLET | ORAL | Status: DC

## 2014-12-14 NOTE — Telephone Encounter (Signed)
I sent both scripts e-scribe. 

## 2015-01-04 ENCOUNTER — Other Ambulatory Visit: Payer: Self-pay | Admitting: Family Medicine

## 2015-03-22 ENCOUNTER — Ambulatory Visit (INDEPENDENT_AMBULATORY_CARE_PROVIDER_SITE_OTHER): Payer: Medicare Other | Admitting: Licensed Clinical Social Worker

## 2015-03-22 DIAGNOSIS — F332 Major depressive disorder, recurrent severe without psychotic features: Secondary | ICD-10-CM

## 2015-03-29 ENCOUNTER — Ambulatory Visit (INDEPENDENT_AMBULATORY_CARE_PROVIDER_SITE_OTHER): Payer: Medicare Other | Admitting: Licensed Clinical Social Worker

## 2015-03-29 DIAGNOSIS — F332 Major depressive disorder, recurrent severe without psychotic features: Secondary | ICD-10-CM

## 2015-03-30 ENCOUNTER — Other Ambulatory Visit: Payer: Self-pay | Admitting: Family Medicine

## 2015-03-30 ENCOUNTER — Encounter: Payer: Self-pay | Admitting: Gastroenterology

## 2015-03-31 ENCOUNTER — Telehealth: Payer: Self-pay | Admitting: Family Medicine

## 2015-03-31 MED ORDER — GLUCOSE BLOOD VI STRP: ORAL_STRIP | Status: DC

## 2015-03-31 MED ORDER — GLIPIZIDE 10 MG PO TABS: ORAL_TABLET | ORAL | Status: DC

## 2015-03-31 NOTE — Telephone Encounter (Signed)
Pt is requesting refill on glipizide 10 mg and one touch ultra test strip. Pt stated that dr. Sarajane Jews want him to test 2 times a day and he want a call when this is sent.

## 2015-03-31 NOTE — Telephone Encounter (Signed)
I sent both scripts e-scribe and spoke with pt. 

## 2015-04-05 ENCOUNTER — Ambulatory Visit (INDEPENDENT_AMBULATORY_CARE_PROVIDER_SITE_OTHER): Payer: Medicare Other | Admitting: Licensed Clinical Social Worker

## 2015-04-05 DIAGNOSIS — F332 Major depressive disorder, recurrent severe without psychotic features: Secondary | ICD-10-CM

## 2015-05-02 ENCOUNTER — Other Ambulatory Visit: Payer: Self-pay

## 2015-05-05 ENCOUNTER — Ambulatory Visit (INDEPENDENT_AMBULATORY_CARE_PROVIDER_SITE_OTHER): Payer: Medicare Other | Admitting: Family Medicine

## 2015-05-05 ENCOUNTER — Encounter: Payer: Self-pay | Admitting: Family Medicine

## 2015-05-05 VITALS — BP 156/103 | HR 86 | Temp 98.3°F | Ht 67.5 in | Wt 269.0 lb

## 2015-05-05 DIAGNOSIS — E119 Type 2 diabetes mellitus without complications: Secondary | ICD-10-CM

## 2015-05-05 DIAGNOSIS — I2510 Atherosclerotic heart disease of native coronary artery without angina pectoris: Secondary | ICD-10-CM

## 2015-05-05 DIAGNOSIS — E785 Hyperlipidemia, unspecified: Secondary | ICD-10-CM | POA: Diagnosis not present

## 2015-05-05 DIAGNOSIS — I10 Essential (primary) hypertension: Secondary | ICD-10-CM

## 2015-05-05 LAB — HEMOGLOBIN A1C: Hgb A1c MFr Bld: 11.8 % — ABNORMAL HIGH (ref 4.6–6.5)

## 2015-05-05 MED ORDER — AMLODIPINE BESYLATE 10 MG PO TABS: 10.0000 mg | ORAL_TABLET | Freq: Every day | ORAL | Status: DC

## 2015-05-05 MED ORDER — HYDROCHLOROTHIAZIDE 25 MG PO TABS: 25.0000 mg | ORAL_TABLET | Freq: Every day | ORAL | Status: DC

## 2015-05-05 NOTE — Progress Notes (Signed)
Pre visit review using our clinic review tool, if applicable. No additional management support is needed unless otherwise documented below in the visit note. 

## 2015-05-06 ENCOUNTER — Encounter: Payer: Self-pay | Admitting: Family Medicine

## 2015-05-06 ENCOUNTER — Telehealth: Payer: Self-pay | Admitting: Family Medicine

## 2015-05-06 MED ORDER — INSULIN LISPRO 100 UNIT/ML ~~LOC~~ SOLN: SUBCUTANEOUS | Status: DC

## 2015-05-06 MED ORDER — INSULIN GLARGINE 100 UNIT/ML SOLOSTAR PEN: 30.0000 [IU] | PEN_INJECTOR | Freq: Every day | SUBCUTANEOUS | Status: DC

## 2015-05-06 NOTE — Telephone Encounter (Signed)
I sent both scripts e-scribe and spoke with pt. 

## 2015-05-06 NOTE — Telephone Encounter (Signed)
Call in Lantus to take 30 units at bedtime, 90 day supply with 3 rf. Also call in Humalog to take 10 units before breakfast and 15 units before supper, 90 day supply with 3 rf. Have him recheck with [place] in 4 weeks

## 2015-05-06 NOTE — Progress Notes (Signed)
   Subjective:    Patient ID: [name], male    DOB: October 18, 1946, 69 y.o.   MRN: [id number]  HPI Here to follow up on HTN, diabetes and high lipids. He feels well lately and he attributes this to the fact that he stopped taking all his medications. He felt tired and his joints ached, and he thinks this was due to the Glipizide, the Metformin, and the Losartan HCT that he w as taking. He admits to not watching his diet much and to getting little exercise. He has not been checking his BP. His am fasting glucoses have been running in the 130s to 150s ranges. He has not been taking Levimir because his insurance would not cover it.    Review of Systems  Constitutional: Negative.   Respiratory: Negative.   Cardiovascular: Negative.   Endocrine: Negative.   Neurological: Negative.        Objective:   Physical Exam  Constitutional: He is oriented to person, place, and time.  obese  Neck: No thyromegaly present.  Cardiovascular: Normal rate, regular rhythm, normal heart sounds and intact distal pulses.   Pulmonary/Chest: Effort normal and breath sounds normal.  Lymphadenopathy:    He has no cervical adenopathy.  Neurological: He is alert and oriented to person, place, and time.          Assessment & Plan:  His problems are out of control and he has been very non-compliant. He refuses to see Dr. Loanne Drilling again. First I think he can avoid taking oral diabetic meds but he absolutely needs to get back on insulin. He agreed to check with his insurance company to see what they will cover and to give [place] this information. I urged him to follow his diet and to exercise. We will start on Amlodipine and HCTZ. We will get fasting labs soon.

## 2015-05-06 NOTE — Telephone Encounter (Signed)
Pt call to say that his insurance does not cover LEVEMIR. Below is a list of the ones they do cover   Humalog, lantus, humulin toujeo  PT REQUEST TO PICK THESE UP TOMORROW 05/07/15    [facility] [place]

## 2015-05-11 MED ORDER — ONETOUCH ULTRA SYSTEM W/DEVICE KIT: 1.0000 | PACK | Freq: Once | Status: DC

## 2015-05-11 NOTE — Addendum Note (Signed)
Addended by: Aggie Hacker A on: 05/11/2015 01:12 PM   Modules accepted: Orders

## 2015-07-27 ENCOUNTER — Ambulatory Visit (INDEPENDENT_AMBULATORY_CARE_PROVIDER_SITE_OTHER): Payer: Medicare Other | Admitting: Family Medicine

## 2015-07-27 ENCOUNTER — Encounter: Payer: Self-pay | Admitting: Family Medicine

## 2015-07-27 VITALS — BP 145/105 | HR 93 | Temp 98.1°F | Ht 67.5 in | Wt 261.0 lb

## 2015-07-27 DIAGNOSIS — I10 Essential (primary) hypertension: Secondary | ICD-10-CM | POA: Diagnosis not present

## 2015-07-27 DIAGNOSIS — E119 Type 2 diabetes mellitus without complications: Secondary | ICD-10-CM

## 2015-07-27 DIAGNOSIS — I2510 Atherosclerotic heart disease of native coronary artery without angina pectoris: Secondary | ICD-10-CM

## 2015-07-27 MED ORDER — LOSARTAN POTASSIUM-HCTZ 50-12.5 MG PO TABS: 1.0000 | ORAL_TABLET | Freq: Every day | ORAL | Status: DC

## 2015-07-27 NOTE — Progress Notes (Signed)
Pre visit review using our clinic review tool, if applicable. No additional management support is needed unless otherwise documented below in the visit note. 

## 2015-07-27 NOTE — Progress Notes (Signed)
   Subjective:    Patient ID: [name], male    DOB: 1946-03-23, 69 y.o.   MRN: [id number]  HPI Here for follow up on diabetes and HTN. He had been seeing Dr. Loanne Drilling for the diabetes, but he was not comfortable with this relationship and Ansh decided not to see him any longer. He had developed stiffness and pain in a number of his joints, and he determined these were side effects of his oral diabetes meds. He stopped taking all these and, in fact, the joint pains went away. We then tried to treat him with insulin, but the joint pains came back and Kate blamed these on the insulin. He stopped taking this, and the joint pains went away again. Now for the past 2 months he has not taken any diabetic medication at all and he feels great. He denies any joint stiffness or pain, and he has increased energy levels. He is watching his diet and walking for exercise, and he has lost about 15 lbs. His random glucoses are mostly in the 200s. Also he as been taking Amlodipine and HCTZ for the HTN, but he says he does not like this combination and he wants to go back on Losartan.    Review of Systems  Constitutional: Negative.   Respiratory: Negative.   Cardiovascular: Negative.   Neurological: Negative.        Objective:   Physical Exam  Constitutional: He is oriented to person, place, and time. He appears well-developed and well-nourished.  Cardiovascular: Normal rate, regular rhythm, normal heart sounds and intact distal pulses.   Pulmonary/Chest: Effort normal and breath sounds normal.  Neurological: He is alert and oriented to person, place, and time.          Assessment & Plan:  For the diabetes he will continue to exercise and watch his diet, with a focus on losing weight. We will recheck this at his cpx next month. For the HTN we will stop Amlodipine and HCTZ, and we will start back on Losartan HCT daily. Recheck at the cpx

## 2015-08-04 DIAGNOSIS — R32 Unspecified urinary incontinence: Secondary | ICD-10-CM | POA: Diagnosis not present

## 2015-08-04 DIAGNOSIS — N401 Enlarged prostate with lower urinary tract symptoms: Secondary | ICD-10-CM | POA: Diagnosis not present

## 2015-08-09 DIAGNOSIS — K1370 Unspecified lesions of oral mucosa: Secondary | ICD-10-CM | POA: Diagnosis not present

## 2015-08-17 ENCOUNTER — Ambulatory Visit (INDEPENDENT_AMBULATORY_CARE_PROVIDER_SITE_OTHER): Payer: Medicare Other | Admitting: Licensed Clinical Social Worker

## 2015-08-17 DIAGNOSIS — F332 Major depressive disorder, recurrent severe without psychotic features: Secondary | ICD-10-CM | POA: Diagnosis not present

## 2015-08-24 DIAGNOSIS — M65341 Trigger finger, right ring finger: Secondary | ICD-10-CM | POA: Diagnosis not present

## 2015-09-02 ENCOUNTER — Ambulatory Visit (INDEPENDENT_AMBULATORY_CARE_PROVIDER_SITE_OTHER): Payer: Medicare Other | Admitting: Family Medicine

## 2015-09-02 ENCOUNTER — Encounter: Payer: Self-pay | Admitting: Family Medicine

## 2015-09-02 VITALS — BP 132/86 | HR 86 | Temp 98.8°F | Ht 67.5 in | Wt 258.0 lb

## 2015-09-02 DIAGNOSIS — M549 Dorsalgia, unspecified: Secondary | ICD-10-CM | POA: Diagnosis not present

## 2015-09-02 DIAGNOSIS — E785 Hyperlipidemia, unspecified: Secondary | ICD-10-CM | POA: Diagnosis not present

## 2015-09-02 DIAGNOSIS — H6123 Impacted cerumen, bilateral: Secondary | ICD-10-CM

## 2015-09-02 DIAGNOSIS — E119 Type 2 diabetes mellitus without complications: Secondary | ICD-10-CM

## 2015-09-02 DIAGNOSIS — N401 Enlarged prostate with lower urinary tract symptoms: Secondary | ICD-10-CM

## 2015-09-02 DIAGNOSIS — Z23 Encounter for immunization: Secondary | ICD-10-CM

## 2015-09-02 DIAGNOSIS — I10 Essential (primary) hypertension: Secondary | ICD-10-CM

## 2015-09-02 DIAGNOSIS — I2510 Atherosclerotic heart disease of native coronary artery without angina pectoris: Secondary | ICD-10-CM

## 2015-09-02 DIAGNOSIS — F329 Major depressive disorder, single episode, unspecified: Secondary | ICD-10-CM | POA: Diagnosis not present

## 2015-09-02 DIAGNOSIS — L409 Psoriasis, unspecified: Secondary | ICD-10-CM

## 2015-09-02 DIAGNOSIS — N138 Other obstructive and reflux uropathy: Secondary | ICD-10-CM

## 2015-09-02 DIAGNOSIS — F32A Depression, unspecified: Secondary | ICD-10-CM

## 2015-09-02 LAB — CBC WITH DIFFERENTIAL/PLATELET
BASOS PCT: 0.6 % (ref 0.0–3.0)
Basophils Absolute: 0.1 10*3/uL (ref 0.0–0.1)
EOS PCT: 2.4 % (ref 0.0–5.0)
Eosinophils Absolute: 0.2 10*3/uL (ref 0.0–0.7)
HCT: 48.3 % (ref 39.0–52.0)
Hemoglobin: 16 g/dL (ref 13.0–17.0)
LYMPHS ABS: 2.8 10*3/uL (ref 0.7–4.0)
Lymphocytes Relative: 27.6 % (ref 12.0–46.0)
MCHC: 33.1 g/dL (ref 30.0–36.0)
MCV: 89 fl (ref 78.0–100.0)
MONO ABS: 0.9 10*3/uL (ref 0.1–1.0)
Monocytes Relative: 8.9 % (ref 3.0–12.0)
NEUTROS ABS: 6.2 10*3/uL (ref 1.4–7.7)
NEUTROS PCT: 60.5 % (ref 43.0–77.0)
Platelets: 314 10*3/uL (ref 150.0–400.0)
RBC: 5.43 Mil/uL (ref 4.22–5.81)
RDW: 13.7 % (ref 11.5–15.5)
WBC: 10.2 10*3/uL (ref 4.0–10.5)

## 2015-09-02 LAB — LIPID PANEL
Cholesterol: 288 mg/dL — ABNORMAL HIGH (ref 0–200)
HDL: 41.4 mg/dL (ref >39.00)
NonHDL: 246.77
TRIGLYCERIDES: 265 mg/dL — AB (ref 0.0–149.0)
Total CHOL/HDL Ratio: 7
VLDL: 53 mg/dL — ABNORMAL HIGH (ref 0.0–40.0)

## 2015-09-02 LAB — BASIC METABOLIC PANEL
BUN: 12 mg/dL (ref 6–23)
CALCIUM: 10.3 mg/dL (ref 8.4–10.5)
CO2: 26 meq/L (ref 19–32)
Chloride: 98 mEq/L (ref 96–112)
Creatinine, Ser: 0.68 mg/dL (ref 0.40–1.50)
GFR: 122.49 mL/min (ref >60.00)
GLUCOSE: 314 mg/dL — AB (ref 70–99)
Potassium: 4.1 mEq/L (ref 3.5–5.1)
SODIUM: 134 meq/L — AB (ref 135–145)

## 2015-09-02 LAB — POCT URINALYSIS DIPSTICK
BILIRUBIN UA: NEGATIVE
Leukocytes, UA: NEGATIVE
Nitrite, UA: NEGATIVE
RBC UA: NEGATIVE
SPEC GRAV UA: 1.02
UROBILINOGEN UA: 0.2
pH, UA: 5

## 2015-09-02 LAB — HEPATIC FUNCTION PANEL
ALT: 19 U/L (ref 0–53)
AST: 15 U/L (ref 0–37)
Albumin: 4 g/dL (ref 3.5–5.2)
Alkaline Phosphatase: 81 U/L (ref 39–117)
BILIRUBIN DIRECT: 0.1 mg/dL (ref 0.0–0.3)
TOTAL PROTEIN: 6.8 g/dL (ref 6.0–8.3)
Total Bilirubin: 0.5 mg/dL (ref 0.2–1.2)

## 2015-09-02 LAB — TSH: TSH: 0.78 u[IU]/mL (ref 0.35–4.50)

## 2015-09-02 LAB — PSA: PSA: 3.39 ng/mL (ref 0.10–4.00)

## 2015-09-02 LAB — LDL CHOLESTEROL, DIRECT: Direct LDL: 213 mg/dL

## 2015-09-02 LAB — HEMOGLOBIN A1C: HEMOGLOBIN A1C: 15 % — AB (ref 4.6–6.5)

## 2015-09-02 MED ORDER — MORPHINE SULFATE 15 MG PO TABS: 15.0000 mg | ORAL_TABLET | Freq: Four times a day (QID) | ORAL | Status: DC | PRN

## 2015-09-02 NOTE — Addendum Note (Signed)
Addended by: Alysia Penna A on: 09/02/2015 05:09 PM   Modules accepted: Orders

## 2015-09-02 NOTE — Progress Notes (Signed)
   Subjective:    Patient ID: [name], male    DOB: May 09, 1946, 69 y.o.   MRN: [id number]  HPI 69 yr old male for a cpx. He feels well but asks if he can have a small supply of pain medication to keep on hand when his back pain flares up. Last week he bent over to pick up a bag of birdseed and felt a sudden pain in the lower back. He had sharp pains with spasms for about 5 days and Ibuprofen dit not help at all. He saw his hearing aid specialist and they told him he has wax in his ears. We switched his BP meds around last time and this has had excellent results. He has lost 3 lbs since the last visit.    Review of Systems  Constitutional: Negative.   HENT: Positive for hearing loss. Negative for congestion, dental problem, drooling, ear discharge, ear pain, facial swelling, mouth sores, nosebleeds, postnasal drip, rhinorrhea, sinus pressure, sneezing and sore throat.   Eyes: Negative.   Respiratory: Negative.   Cardiovascular: Negative.   Gastrointestinal: Negative.   Genitourinary: Negative.   Musculoskeletal: Negative.   Skin: Negative.   Neurological: Negative.   Psychiatric/Behavioral: Negative.        Objective:   Physical Exam  Constitutional: He is oriented to person, place, and time. No distress.  Obese   HENT:  Head: Normocephalic and atraumatic.  Nose: Nose normal.  Mouth/Throat: Oropharynx is clear and moist. No oropharyngeal exudate.  Both ear canals are full of cerumen   Eyes: Conjunctivae and EOM are normal. Pupils are equal, round, and reactive to light. Right eye exhibits no discharge. Left eye exhibits no discharge. No scleral icterus.  Neck: Neck supple. No JVD present. No tracheal deviation present. No thyromegaly present.  Cardiovascular: Normal rate, regular rhythm, normal heart sounds and intact distal pulses.  Exam reveals no gallop and no friction rub.   No murmur heard. EKG normal   Pulmonary/Chest: Effort normal and breath sounds normal. No  respiratory distress. He has no wheezes. He has no rales. He exhibits no tenderness.  Abdominal: Soft. Bowel sounds are normal. He exhibits no distension and no mass. There is no tenderness. There is no rebound and no guarding.  Genitourinary: Rectum normal, prostate normal and penis normal. Guaiac negative stool. No penile tenderness.  Musculoskeletal: Normal range of motion. He exhibits no edema or tenderness.  Lymphadenopathy:    He has no cervical adenopathy.  Neurological: He is alert and oriented to person, place, and time. He has normal reflexes. No cranial nerve deficit. He exhibits normal muscle tone. Coordination normal.  Skin: Skin is warm and dry. No rash noted. He is not diaphoretic. No erythema. No pallor.  Psychiatric: He has a normal mood and affect. His behavior is normal. Judgment and thought content normal.          Assessment & Plan:  His HTN is stable. We will get fasting labs today to check his diabetes, among other things. Given some morphine to use when his back pain flares up. Both ears were irrigated clear with water.

## 2015-09-02 NOTE — Progress Notes (Signed)
Pre visit review using our clinic review tool, if applicable. No additional management support is needed unless otherwise documented below in the visit note. 

## 2015-09-28 DIAGNOSIS — R32 Unspecified urinary incontinence: Secondary | ICD-10-CM | POA: Diagnosis not present

## 2015-09-28 DIAGNOSIS — N400 Enlarged prostate without lower urinary tract symptoms: Secondary | ICD-10-CM | POA: Diagnosis not present

## 2015-10-04 ENCOUNTER — Ambulatory Visit (INDEPENDENT_AMBULATORY_CARE_PROVIDER_SITE_OTHER): Payer: Medicare Other | Admitting: Family Medicine

## 2015-10-04 ENCOUNTER — Encounter: Payer: Self-pay | Admitting: Family Medicine

## 2015-10-04 VITALS — BP 124/96 | HR 100 | Temp 98.6°F | Ht 67.5 in | Wt 256.0 lb

## 2015-10-04 DIAGNOSIS — R197 Diarrhea, unspecified: Secondary | ICD-10-CM | POA: Diagnosis not present

## 2015-10-04 DIAGNOSIS — I2510 Atherosclerotic heart disease of native coronary artery without angina pectoris: Secondary | ICD-10-CM | POA: Diagnosis not present

## 2015-10-04 NOTE — Progress Notes (Signed)
Pre visit review using our clinic review tool, if applicable. No additional management support is needed unless otherwise documented below in the visit note. 

## 2015-10-04 NOTE — Progress Notes (Signed)
   Subjective:    Patient ID: [name], male    DOB: [birth year], 69 y.o.   MRN: [id number]  HPI Here for continued bouts of diarrhea and bowel urgency after he eats. This has been a problem for about 2 years. It improved slightly with taking probiotics, but it still poses a significant problem. No fevers or nausea. No blood on the stools.    Review of Systems  Constitutional: Negative.   Respiratory: Negative.   Cardiovascular: Negative.   Gastrointestinal: Positive for diarrhea. Negative for nausea, vomiting, abdominal pain, constipation, blood in stool, abdominal distention, anal bleeding and rectal pain.  Genitourinary: Negative.        Objective:   Physical Exam  Constitutional: He appears well-developed and well-nourished.  Cardiovascular: Normal rate, regular rhythm, normal heart sounds and intact distal pulses.   Pulmonary/Chest: Effort normal and breath sounds normal.  Abdominal: Soft. Bowel sounds are normal. He exhibits no distension and no mass. There is no tenderness. There is no rebound and no guarding.          Assessment & Plan:  Possible IBS. Refer to GI

## 2015-10-05 DIAGNOSIS — L218 Other seborrheic dermatitis: Secondary | ICD-10-CM | POA: Diagnosis not present

## 2015-10-11 ENCOUNTER — Other Ambulatory Visit: Payer: Self-pay | Admitting: Family Medicine

## 2015-10-18 ENCOUNTER — Telehealth: Payer: Self-pay | Admitting: Family Medicine

## 2015-10-18 NOTE — Telephone Encounter (Signed)
Refill request for Cialis 50 mg take 1 po qd and send to [facility].

## 2015-10-19 DIAGNOSIS — R197 Diarrhea, unspecified: Secondary | ICD-10-CM | POA: Diagnosis not present

## 2015-10-19 DIAGNOSIS — K625 Hemorrhage of anus and rectum: Secondary | ICD-10-CM | POA: Diagnosis not present

## 2015-10-19 DIAGNOSIS — R1084 Generalized abdominal pain: Secondary | ICD-10-CM | POA: Diagnosis not present

## 2015-10-19 MED ORDER — TADALAFIL 5 MG PO TABS: 5.0000 mg | ORAL_TABLET | Freq: Every day | ORAL | Status: DC

## 2015-10-19 NOTE — Telephone Encounter (Signed)
He actually means 5 mg a day. Call in #[phone] with 3 rf

## 2015-10-19 NOTE — Telephone Encounter (Signed)
I sent script e-scribe. 

## 2016-01-25 ENCOUNTER — Other Ambulatory Visit: Payer: Self-pay | Admitting: Family Medicine

## 2016-06-15 ENCOUNTER — Other Ambulatory Visit: Payer: Self-pay | Admitting: Family Medicine

## 2016-07-04 ENCOUNTER — Other Ambulatory Visit: Payer: Self-pay | Admitting: Family Medicine

## 2016-09-03 ENCOUNTER — Ambulatory Visit (INDEPENDENT_AMBULATORY_CARE_PROVIDER_SITE_OTHER): Payer: Medicare Other | Admitting: Family Medicine

## 2016-09-03 ENCOUNTER — Other Ambulatory Visit: Payer: Self-pay | Admitting: Family Medicine

## 2016-09-03 ENCOUNTER — Encounter: Payer: Self-pay | Admitting: Family Medicine

## 2016-09-03 VITALS — BP 150/81 | HR 85 | Temp 98.2°F | Ht 67.5 in | Wt 240.0 lb

## 2016-09-03 DIAGNOSIS — F411 Generalized anxiety disorder: Secondary | ICD-10-CM | POA: Diagnosis not present

## 2016-09-03 DIAGNOSIS — I10 Essential (primary) hypertension: Secondary | ICD-10-CM

## 2016-09-03 DIAGNOSIS — N401 Enlarged prostate with lower urinary tract symptoms: Secondary | ICD-10-CM

## 2016-09-03 DIAGNOSIS — E785 Hyperlipidemia, unspecified: Secondary | ICD-10-CM | POA: Diagnosis not present

## 2016-09-03 DIAGNOSIS — S1086XA Insect bite of other specified part of neck, initial encounter: Secondary | ICD-10-CM

## 2016-09-03 DIAGNOSIS — K219 Gastro-esophageal reflux disease without esophagitis: Secondary | ICD-10-CM

## 2016-09-03 DIAGNOSIS — E119 Type 2 diabetes mellitus without complications: Secondary | ICD-10-CM | POA: Diagnosis not present

## 2016-09-03 DIAGNOSIS — R21 Rash and other nonspecific skin eruption: Secondary | ICD-10-CM | POA: Diagnosis not present

## 2016-09-03 DIAGNOSIS — N138 Other obstructive and reflux uropathy: Secondary | ICD-10-CM | POA: Diagnosis not present

## 2016-09-03 DIAGNOSIS — Z23 Encounter for immunization: Secondary | ICD-10-CM | POA: Diagnosis not present

## 2016-09-03 DIAGNOSIS — W57XXXA Bitten or stung by nonvenomous insect and other nonvenomous arthropods, initial encounter: Secondary | ICD-10-CM

## 2016-09-03 LAB — CBC WITH DIFFERENTIAL/PLATELET
BASOS PCT: 0.4 % (ref 0.0–3.0)
Basophils Absolute: 0 10*3/uL (ref 0.0–0.1)
EOS PCT: 1.8 % (ref 0.0–5.0)
Eosinophils Absolute: 0.2 10*3/uL (ref 0.0–0.7)
HCT: 48.3 % (ref 39.0–52.0)
Hemoglobin: 16.3 g/dL (ref 13.0–17.0)
LYMPHS ABS: 2.9 10*3/uL (ref 0.7–4.0)
Lymphocytes Relative: 23.4 % (ref 12.0–46.0)
MCHC: 33.7 g/dL (ref 30.0–36.0)
MCV: 88.3 fl (ref 78.0–100.0)
MONO ABS: 1.1 10*3/uL — AB (ref 0.1–1.0)
Monocytes Relative: 8.7 % (ref 3.0–12.0)
NEUTROS ABS: 8.1 10*3/uL — AB (ref 1.4–7.7)
NEUTROS PCT: 65.7 % (ref 43.0–77.0)
PLATELETS: 305 10*3/uL (ref 150.0–400.0)
RBC: 5.47 Mil/uL (ref 4.22–5.81)
RDW: 13.6 % (ref 11.5–15.5)
WBC: 12.3 10*3/uL — ABNORMAL HIGH (ref 4.0–10.5)

## 2016-09-03 LAB — POC URINALSYSI DIPSTICK (AUTOMATED)
Bilirubin, UA: NEGATIVE
Leukocytes, UA: NEGATIVE
Nitrite, UA: NEGATIVE
RBC UA: NEGATIVE
SPEC GRAV UA: 1.01
Urobilinogen, UA: 0.2
pH, UA: 5.5

## 2016-09-03 LAB — HEPATIC FUNCTION PANEL
ALBUMIN: 4.2 g/dL (ref 3.5–5.2)
ALK PHOS: 79 U/L (ref 39–117)
ALT: 14 U/L (ref 0–53)
AST: 11 U/L (ref 0–37)
BILIRUBIN DIRECT: 0.1 mg/dL (ref 0.0–0.3)
TOTAL PROTEIN: 7 g/dL (ref 6.0–8.3)
Total Bilirubin: 0.5 mg/dL (ref 0.2–1.2)

## 2016-09-03 LAB — TSH: TSH: 0.79 u[IU]/mL (ref 0.35–4.50)

## 2016-09-03 LAB — LIPID PANEL
CHOLESTEROL: 309 mg/dL — AB (ref 0–200)
HDL: 43.4 mg/dL (ref >39.00)
NonHDL: 265.45
Total CHOL/HDL Ratio: 7
Triglycerides: 206 mg/dL — ABNORMAL HIGH (ref 0.0–149.0)
VLDL: 41.2 mg/dL — AB (ref 0.0–40.0)

## 2016-09-03 LAB — BASIC METABOLIC PANEL
BUN: 11 mg/dL (ref 6–23)
CALCIUM: 10.7 mg/dL — AB (ref 8.4–10.5)
CO2: 26 meq/L (ref 19–32)
Chloride: 100 mEq/L (ref 96–112)
Creatinine, Ser: 0.66 mg/dL (ref 0.40–1.50)
GFR: 126.42 mL/min (ref >60.00)
GLUCOSE: 341 mg/dL — AB (ref 70–99)
POTASSIUM: 4.8 meq/L (ref 3.5–5.1)
SODIUM: 135 meq/L (ref 135–145)

## 2016-09-03 LAB — PSA: PSA: 4.05 ng/mL — ABNORMAL HIGH (ref 0.10–4.00)

## 2016-09-03 LAB — LDL CHOLESTEROL, DIRECT: Direct LDL: 253 mg/dL

## 2016-09-03 LAB — HEMOGLOBIN A1C: HEMOGLOBIN A1C: 14.5 % — AB (ref 4.6–6.5)

## 2016-09-03 MED ORDER — OMEPRAZOLE 20 MG PO CPDR: DELAYED_RELEASE_CAPSULE | ORAL | 11 refills | Status: DC

## 2016-09-03 MED ORDER — LOSARTAN POTASSIUM-HCTZ 50-12.5 MG PO TABS: 1.0000 | ORAL_TABLET | Freq: Every day | ORAL | 11 refills | Status: DC

## 2016-09-03 MED ORDER — FLUOXETINE HCL 40 MG PO CAPS: ORAL_CAPSULE | ORAL | 11 refills | Status: DC

## 2016-09-03 MED ORDER — GLUCOSE BLOOD VI STRP: ORAL_STRIP | 3 refills | Status: DC

## 2016-09-03 NOTE — Progress Notes (Signed)
   Subjective:    Patient ID: [name], male    DOB: 09/18/1946, 70 y.o.   MRN: [id number]  HPI 70 yr old male for a well exam. He feels fine but he does mention a tick bite about a month ago. He noticed a tick embedded in the right posterior neck, and he pulled this out with tweezers. He denies any headache or body aches or fever or rash since then.    Review of Systems  Constitutional: Negative.   HENT: Negative.   Eyes: Negative.   Respiratory: Negative.   Cardiovascular: Negative.   Gastrointestinal: Negative.   Genitourinary: Negative.   Musculoskeletal: Negative.   Skin: Negative.   Neurological: Negative.   Psychiatric/Behavioral: Negative.        Objective:   Physical Exam  Constitutional: He is oriented to person, place, and time. No distress.  Obese   HENT:  Head: Normocephalic and atraumatic.  Right Ear: External ear normal.  Left Ear: External ear normal.  Nose: Nose normal.  Mouth/Throat: Oropharynx is clear and moist. No oropharyngeal exudate.  Eyes: Conjunctivae and EOM are normal. Pupils are equal, round, and reactive to light. Right eye exhibits no discharge. Left eye exhibits no discharge. No scleral icterus.  Neck: Neck supple. No JVD present. No tracheal deviation present. No thyromegaly present.  Cardiovascular: Normal rate, regular rhythm, normal heart sounds and intact distal pulses.  Exam reveals no gallop and no friction rub.   No murmur heard. EKG normal   Pulmonary/Chest: Effort normal and breath sounds normal. No respiratory distress. He has no wheezes. He has no rales. He exhibits no tenderness.  Abdominal: Soft. Bowel sounds are normal. He exhibits no distension and no mass. There is no tenderness. There is no rebound and no guarding.  Musculoskeletal: Normal range of motion. He exhibits no edema or tenderness.  Lymphadenopathy:    He has no cervical adenopathy.  Neurological: He is alert and oriented to person, place, and time. He has  normal reflexes. No cranial nerve deficit. He exhibits normal muscle tone. Coordination normal.  Skin: Skin is warm and dry. No rash noted. He is not diaphoretic. No erythema. No pallor.  Psychiatric: He has a normal mood and affect. His behavior is normal. Judgment and thought content normal.          Assessment & Plan:  Well exam. We will get fasting labs today to follow his diabetes etc. His BP is stable (he gets readings of Q000111Q and 0000000 systolic at home). To screen after the tick bite we will check for Lyme disease. He is due to follow up with his Urologist soon.  Laurey Morale, MD

## 2016-09-03 NOTE — Progress Notes (Signed)
Pre visit review using our clinic review tool, if applicable. No additional management support is needed unless otherwise documented below in the visit note. 

## 2016-09-04 LAB — LYME AB/WESTERN BLOT REFLEX

## 2016-09-10 ENCOUNTER — Encounter: Payer: Self-pay | Admitting: Family Medicine

## 2016-09-10 ENCOUNTER — Encounter (INDEPENDENT_AMBULATORY_CARE_PROVIDER_SITE_OTHER): Payer: Self-pay

## 2016-09-10 ENCOUNTER — Inpatient Hospital Stay: Admission: RE | Admit: 2016-09-10 | Payer: Self-pay | Source: Ambulatory Visit

## 2016-09-10 ENCOUNTER — Ambulatory Visit (INDEPENDENT_AMBULATORY_CARE_PROVIDER_SITE_OTHER): Payer: Medicare Other | Admitting: Family Medicine

## 2016-09-10 VITALS — BP 150/96 | HR 84 | Temp 98.9°F | Ht 67.5 in | Wt 246.0 lb

## 2016-09-10 DIAGNOSIS — S060X0A Concussion without loss of consciousness, initial encounter: Secondary | ICD-10-CM | POA: Diagnosis not present

## 2016-09-10 DIAGNOSIS — E119 Type 2 diabetes mellitus without complications: Secondary | ICD-10-CM | POA: Diagnosis not present

## 2016-09-10 MED ORDER — INSULIN GLARGINE 100 UNIT/ML SOLOSTAR PEN: 40.0000 [IU] | PEN_INJECTOR | Freq: Every day | SUBCUTANEOUS | 5 refills | Status: DC

## 2016-09-10 MED ORDER — INSULIN LISPRO 100 UNIT/ML (KWIKPEN): 15.0000 [IU] | PEN_INJECTOR | Freq: Two times a day (BID) | SUBCUTANEOUS | 5 refills | Status: DC

## 2016-09-10 NOTE — Progress Notes (Signed)
   Subjective:    Patient ID: [name], male    DOB: [birth year], 70 y.o.   MRN: [id number]  HPI Here to discuss a head injury that occurred on 09-08-16. While he was helping his son-in-law carry a large box down some steps he lost his balance and feel backwards, with the back of his head striking a stone floor. There was no LOC and he remembers everything. He immediately had a severe posterior headache, and the headache has persisted ever since, although the severity of the headache has improved. No nausea or vomiting. No vision changes. He does feel unsteady on his feet and has a slight "haze" to his thought processes. No neck or back pain. Also we recently spoke about the fact that his diabetes is very poorly controlled and about his refusal to take any oral medications for this. Today he says he is willing to try insulin therapy again.    Review of Systems  Constitutional: Negative.   HENT: Negative.   Eyes: Negative.   Respiratory: Negative.   Cardiovascular: Negative.   Neurological: Positive for light-headedness and headaches. Negative for dizziness, tremors, seizures, syncope, facial asymmetry, speech difficulty, weakness and numbness.       Objective:   Physical Exam  Constitutional: He is oriented to person, place, and time. He appears well-developed and well-nourished. No distress.  HENT:  Head: Normocephalic and atraumatic.  Eyes: Conjunctivae and EOM are normal. Pupils are equal, round, and reactive to light.  Neck: Normal range of motion. Neck supple.  Cardiovascular: Normal rate, regular rhythm, normal heart sounds and intact distal pulses.   Pulmonary/Chest: Effort normal and breath sounds normal.  Neurological: He is alert and oriented to person, place, and time. No cranial nerve deficit. He exhibits normal muscle tone.  His gait is normal but he is unsteady when standing with eyes closed and feet together          Assessment & Plan:  He has a concussion and we  need to rule out an intracranial bleed, etc. We will get a head CT scan today. Assuming this looks okay, he will rest and we will recheck him in a week. For the diabetes we will start him on Humalog 15 units before breakfast and dinner, and then Lantus 40 units at bedtime.  Laurey Morale, MD

## 2016-09-10 NOTE — Progress Notes (Signed)
Pre visit review using our clinic review tool, if applicable. No additional management support is needed unless otherwise documented below in the visit note. 

## 2016-09-11 ENCOUNTER — Ambulatory Visit (INDEPENDENT_AMBULATORY_CARE_PROVIDER_SITE_OTHER)
Admission: RE | Admit: 2016-09-11 | Discharge: 2016-09-11 | Disposition: A | Discharge status: Home / Self Care | Payer: Medicare Other | Source: Ambulatory Visit | Attending: Family Medicine | Admitting: Family Medicine

## 2016-09-11 DIAGNOSIS — S060X0A Concussion without loss of consciousness, initial encounter: Secondary | ICD-10-CM

## 2016-09-11 DIAGNOSIS — S0990XA Unspecified injury of head, initial encounter: Secondary | ICD-10-CM | POA: Diagnosis not present

## 2016-11-09 ENCOUNTER — Encounter: Payer: Self-pay | Admitting: Family Medicine

## 2016-11-09 ENCOUNTER — Ambulatory Visit (INDEPENDENT_AMBULATORY_CARE_PROVIDER_SITE_OTHER)
Admission: RE | Admit: 2016-11-09 | Discharge: 2016-11-09 | Disposition: A | Discharge status: Home / Self Care | Payer: Medicare Other | Source: Ambulatory Visit | Attending: Family Medicine | Admitting: Family Medicine

## 2016-11-09 ENCOUNTER — Ambulatory Visit (INDEPENDENT_AMBULATORY_CARE_PROVIDER_SITE_OTHER): Payer: Medicare Other | Admitting: Family Medicine

## 2016-11-09 VITALS — BP 144/92 | HR 87 | Temp 98.1°F | Ht 67.5 in | Wt 250.0 lb

## 2016-11-09 DIAGNOSIS — R310 Gross hematuria: Secondary | ICD-10-CM | POA: Diagnosis not present

## 2016-11-09 LAB — POC URINALSYSI DIPSTICK (AUTOMATED)
Bilirubin, UA: NEGATIVE
Blood, UA: NEGATIVE
LEUKOCYTES UA: NEGATIVE
NITRITE UA: NEGATIVE
Spec Grav, UA: 1.025
Urobilinogen, UA: 0.2
pH, UA: 6

## 2016-11-09 NOTE — Addendum Note (Signed)
Addended by: Aggie Hacker A on: 11/09/2016 10:08 AM   Modules accepted: Orders

## 2016-11-09 NOTE — Progress Notes (Signed)
   Subjective:    Patient ID: [name], male    DOB: 19-Oct-1946, 71 y.o.   MRN: [id number]  HPI Here for 8 days of passing blood or blood clots in the urine, along with left middle back pain. No urgency or burning. He has been nauseated but has not vomited. No fever. He has a hx of kidney stones., and he is fairly sure this is another one. He is scheduled for a routine follow up visit with his urologist, Dr. Peterson Lombard in [place], on 11-30-16.    Review of Systems  Constitutional: Negative.   Respiratory: Negative.   Cardiovascular: Negative.   Gastrointestinal: Positive for nausea. Negative for abdominal distention, abdominal pain, anal bleeding, blood in stool, constipation, diarrhea, rectal pain and vomiting.  Genitourinary: Positive for flank pain and hematuria. Negative for dysuria, frequency and urgency.  Musculoskeletal: Positive for back pain.       Objective:   Physical Exam  Constitutional: He is oriented to person, place, and time. He appears well-developed. No distress.  Cardiovascular: Normal rate, regular rhythm, normal heart sounds and intact distal pulses.   Pulmonary/Chest: Effort normal. No respiratory distress. He has no rales.  Soft scattered wheezes   Abdominal: Soft. Bowel sounds are normal. He exhibits no distension and no mass. There is no rebound and no guarding.  Mildly tender in the LLQ   Neurological: He is alert and oriented to person, place, and time.          Assessment & Plan:  Hematuria with back pain, likely due to a renal or ureteral stone. We will send him for a CT scan this am and will treat accordingly.  Alysia Penna, MD

## 2016-11-09 NOTE — Progress Notes (Signed)
Pre visit review using our clinic review tool, if applicable. No additional management support is needed unless otherwise documented below in the visit note. 

## 2016-11-11 LAB — URINE CULTURE: Organism ID, Bacteria: NO GROWTH

## 2016-11-11 NOTE — Addendum Note (Signed)
Addended by: Alysia Penna A on: 11/11/2016 10:18 PM   Modules accepted: Orders

## 2016-11-14 ENCOUNTER — Encounter: Payer: Self-pay | Admitting: Family Medicine

## 2016-11-14 ENCOUNTER — Ambulatory Visit (INDEPENDENT_AMBULATORY_CARE_PROVIDER_SITE_OTHER): Payer: Medicare Other | Admitting: Family Medicine

## 2016-11-14 VITALS — BP 140/90 | HR 102 | Temp 99.2°F | Ht 67.5 in | Wt 245.1 lb

## 2016-11-14 DIAGNOSIS — R059 Cough, unspecified: Secondary | ICD-10-CM

## 2016-11-14 DIAGNOSIS — L02214 Cutaneous abscess of groin: Secondary | ICD-10-CM | POA: Diagnosis not present

## 2016-11-14 DIAGNOSIS — R05 Cough: Secondary | ICD-10-CM | POA: Diagnosis not present

## 2016-11-14 MED ORDER — DOXYCYCLINE HYCLATE 100 MG PO CAPS: 100.0000 mg | ORAL_CAPSULE | Freq: Two times a day (BID) | ORAL | 0 refills | Status: DC

## 2016-11-14 NOTE — Progress Notes (Signed)
Subjective:     Patient ID: [name], male   DOB: May 31, 1946, 71 y.o.   MRN: [id number]  HPI Patient seen as a work in with the following issues:  1 week history of cough. This started off dry but now productive of yellow sputum. He's had some wheezing intermittently. He denies any major nasal congestion. He is not aware of any fevers or chills. Multiple chronic problems including history of CAD, hypertension, GERD, type 2 diabetes, rheumatoid arthritis.  Second problem is 2 day history of swelling in the right groin region. He's noticed some moisture in that region and pain and swelling. No history of hernia.  No hx of MRSA.  Past Medical History:  Diagnosis Date  . Allergy   . BPH (benign prostatic hyperplasia)    sees Dr. Peterson Lombard in [facility]   . CAD (coronary artery disease)   . Depression   . Diabetes mellitus   . Elevated PSA    sees Dr. Peterson Lombard in [facility]  . GERD (gastroesophageal reflux disease)   . Hip pain, bilateral    sees Dr. Alvan Dame   . Hyperlipidemia   . Hypertension   . Hypogonadism male   . Insomnia    Past Surgical History:  Procedure Laterality Date  . CHOLECYSTECTOMY    . COLONOSCOPY  09-14-08   per Dr. Deatra Ina, repeat in 10 yrs   . HERNIA REPAIR     umbilical  . TRANSURETHRAL RESECTION OF PROSTATE  January 2010   per Dr. Ky Barban    reports that he has quit smoking. He has never used smokeless tobacco. He reports that he does not drink alcohol or use drugs. family history is not on file. Allergies  Allergen Reactions  . Codeine Phosphate     REACTION: vomiting     Review of Systems  Constitutional: Negative for chills and fever.  HENT: Positive for congestion.   Respiratory: Positive for cough, shortness of breath and wheezing.   Cardiovascular: Negative for chest pain.  Gastrointestinal: Negative for nausea and vomiting.       Objective:   Physical Exam  Constitutional: He appears well-developed and well-nourished.  HENT:   Right Ear: External ear normal.  Left Ear: External ear normal.  Mouth/Throat: Oropharynx is clear and moist.  Cardiovascular: Normal rate and regular rhythm.   Pulmonary/Chest: Effort normal. No respiratory distress. He has no rales.  Skin:  Right internal region reveals area of erythema approximately 3X3 cm and near the center he has an area fluctuance about 1 cm diameter and he does have some pus draining from the surface. Minimally tender.       Assessment:     #1 cough. Differential is viral bronchitis and less likely pneumonia. Possible mild reactive airway component  #2 abscess right groin    Plan:     -Recommend incision and drainage of right groin abscess. Patient consented. Anesthesia with 1% plain Xylocaine. Linear incision with #11 blade.  Probed wound cavity and copious pus expressed.  Wound cavity packed with iodiform gauze.  Minimal bleeding.  Pt tolerated well. Keep dry.  Outer dressing applied.  Follow up in 2 days for packing removal.    -Start doxycycline 100 mg twice daily for 10 days  Eulas Post MD [facility]

## 2016-11-14 NOTE — Progress Notes (Signed)
Pre visit review using our clinic review tool, if applicable. No additional management support is needed unless otherwise documented below in the visit note. 

## 2016-11-14 NOTE — Patient Instructions (Addendum)
Skin Abscess A skin abscess is an infected area on or under your skin that contains a collection of pus and other material. An abscess may also be called a furuncle, carbuncle, or boil. An abscess can occur in or on almost any part of your body. Some abscesses break open (rupture) on their own. Most continue to get worse unless they are treated. The infection can spread deeper into the body and eventually into your blood, which can make you feel ill. Treatment usually involves draining the abscess. What are the causes? An abscess occurs when germs, often bacteria, pass through your skin and cause an infection. This may be caused by:  A scrape or cut on your skin.  A puncture wound through your skin, including a needle injection.  Blocked oil or sweat glands.  Blocked and infected hair follicles.  A cyst that forms beneath your skin (sebaceous cyst) and becomes infected. What increases the risk? This condition is more likely to develop in people who:  Have a weak body defense system (immune system).  Have diabetes.  Have dry and irritated skin.  Get frequent injections or use illegal IV drugs.  Have a foreign body in a wound, such as a splinter.  Have problems with their lymph system or veins. What are the signs or symptoms? An abscess may start as a painful, firm bump under the skin. Over time, the abscess may get larger or become softer. Pus may appear at the top of the abscess, causing pressure and pain. It may eventually break through the skin and drain. Other symptoms include:  Redness.  Warmth.  Swelling.  Tenderness.  A sore on the skin. How is this diagnosed? This condition is diagnosed based on your medical history and a physical exam. A sample of pus may be taken from the abscess to find out what is causing the infection and what antibiotics can be used to treat it. You also may have:  Blood tests to look for signs of infection or spread of an infection to your  blood.  Imaging studies such as ultrasound, CT scan, or MRI if the abscess is deep. How is this treated? Small abscesses that drain on their own may not need treatment. Treatment for an abscess that does not rupture on its own may include:  Warm compresses applied to the area several times per day.  Incision and drainage. Your health care provider will make an incision to open the abscess and will remove pus and any foreign body or dead tissue. The incision area may be packed with gauze to keep it open for a few days while it heals.  Antibiotic medicines to treat infection. For a severe abscess, you may first get antibiotics through an IV and then change to oral antibiotics. Follow these instructions at home: Abscess Care   If you have an abscess that has not drained, place a warm, clean, wet washcloth over the abscess several times a day. Do this as told by your health care provider.  Follow instructions from your health care provider about how to take care of your abscess. Make sure you:  Cover the abscess with a bandage (dressing).  Change your dressing or gauze as told by your health care provider.  Wash your hands with soap and water before you change the dressing or gauze. If soap and water are not available, use hand sanitizer.  Check your abscess every day for signs of a worsening infection. Check for:  More redness, swelling, or   pain.  More fluid or blood.  Warmth.  More pus or a bad smell. Medicines   Take over-the-counter and prescription medicines only as told by your health care provider.  If you were prescribed an antibiotic medicine, take it as told by your health care provider. Do not stop taking the antibiotic even if you start to feel better. General instructions   To avoid spreading the infection:  Do not share personal care items, towels, or hot tubs with others.  Avoid making skin contact with other people.  Keep all follow-up visits as told by your  health care provider. This is important. Contact a health care provider if:  You have more redness, swelling, or pain around your abscess.  You have more fluid or blood coming from your abscess.  Your abscess feels warm to the touch.  You have more pus or a bad smell coming from your abscess.  You have a fever.  You have muscle aches.  You have chills or a general ill feeling. Get help right away if:  You have severe pain.  You see red streaks on your skin spreading away from the abscess. This information is not intended to replace advice given to you by your health care provider. Make sure you discuss any questions you have with your health care provider. Document Released: 08/01/2005 Document Revised: 06/17/2016 Document Reviewed: 08/31/2015 Elsevier Interactive Patient Education  2017 [employer]  

## 2016-11-16 ENCOUNTER — Ambulatory Visit (INDEPENDENT_AMBULATORY_CARE_PROVIDER_SITE_OTHER): Payer: Medicare Other | Admitting: Family Medicine

## 2016-11-16 ENCOUNTER — Encounter: Payer: Self-pay | Admitting: Family Medicine

## 2016-11-16 VITALS — BP 140/100 | HR 93 | Temp 98.2°F | Ht 67.5 in | Wt 245.0 lb

## 2016-11-16 DIAGNOSIS — R059 Cough, unspecified: Secondary | ICD-10-CM

## 2016-11-16 DIAGNOSIS — L02214 Cutaneous abscess of groin: Secondary | ICD-10-CM

## 2016-11-16 DIAGNOSIS — R05 Cough: Secondary | ICD-10-CM | POA: Diagnosis not present

## 2016-11-16 NOTE — Progress Notes (Signed)
Subjective:     Patient ID: [name], male   DOB: Mar 13, 1946, 71 y.o.   MRN: [id number]  HPI Patient seen for follow-up regarding right groin abscess and cough. He denies any fevers or chills. Having less pain in the groin region. We performed incision drainage with packing couple days ago. We started him on doxycycline. He is taking this without side effects. He had no fevers or chills. Has changed outer dressing once.   He has persistent cough which is mostly dry. No dyspnea. No chest pains.  Not aware of any wheezing.  Past Medical History:  Diagnosis Date  . Allergy   . BPH (benign prostatic hyperplasia)    sees Dr. Peterson Lombard in [facility]   . CAD (coronary artery disease)   . Depression   . Diabetes mellitus   . Elevated PSA    sees Dr. Peterson Lombard in [place]  . GERD (gastroesophageal reflux disease)   . Hip pain, bilateral    sees Dr. Alvan Dame   . Hyperlipidemia   . Hypertension   . Hypogonadism male   . Insomnia    Past Surgical History:  Procedure Laterality Date  . CHOLECYSTECTOMY    . COLONOSCOPY  09-14-08   per Dr. Deatra Ina, repeat in 10 yrs   . HERNIA REPAIR     umbilical  . TRANSURETHRAL RESECTION OF PROSTATE  January 2010   per Dr. Ky Barban    reports that he has quit smoking. He has never used smokeless tobacco. He reports that he does not drink alcohol or use drugs. family history is not on file. Allergies  Allergen Reactions  . Codeine Phosphate     REACTION: vomiting     Review of Systems  Constitutional: Negative for chills and fever.  Respiratory: Positive for cough. Negative for shortness of breath and wheezing.   Cardiovascular: Negative for chest pain.       Objective:   Physical Exam  Constitutional: He appears well-developed and well-nourished.  Cardiovascular: Normal rate and regular rhythm.   Pulmonary/Chest: Effort normal and breath sounds normal. No respiratory distress. He has no wheezes. He has no rales.  Skin:  Packing is removed  from right groin abscess He has less erythema and less swelling and less tenderness. Essentially no drainage at this time. No fluctuance       Assessment:     #1 abscess right groin improved following incision drainage  #2 cough-probably viral    Plan:     -Finish out doxycycline -Keep wound clean daily with soap and water -Follow-up immediately for any redness, warmth or other concerns  Eulas Post MD [facility]

## 2016-11-16 NOTE — Patient Instructions (Signed)
Clean daily with soap and water. Follow up for any recurrent redness, pain , or swelling.

## 2016-11-16 NOTE — Progress Notes (Signed)
Pre visit review using our clinic review tool, if applicable. No additional management support is needed unless otherwise documented below in the visit note. 

## 2016-11-23 ENCOUNTER — Ambulatory Visit: Payer: Self-pay | Admitting: Family Medicine

## 2016-11-23 ENCOUNTER — Telehealth: Payer: Self-pay | Admitting: Family Medicine

## 2016-11-23 NOTE — Telephone Encounter (Signed)
[facility] - Client [facility] Patient Name: LENNEX ROHM DOB: 1946/07/02 Initial Comment Tray states he thinks he has food poisening. He started vomiting last night. Nurse Assessment Nurse: Markus Daft, RN, Sherre Poot Date/Time (Eastern Time): 11/23/2016 12:04:48 PM Confirm and document reason for call. If symptomatic, describe symptoms. ---Vedad states he thinks he has food poisoning. He started vomiting last night - 3 times within an hr. Diarrhea (12 times in last 24 hrs). No fever. - He's been sick for about 3 wks. passing blood clots when urinating. Hasn't seen urologist yet as he had s/s of flu with body aches, weakness, coughing. Has appt in one wk. Does the patient have any new or worsening symptoms? ---Yes Will a triage be completed? ---Yes Related visit to physician within the last 2 weeks? ---No Does the PT have any chronic conditions? (i.e. diabetes, asthma, etc.) ---Yes List chronic conditions. ---DM, HTN Is this a behavioral health or substance abuse call? ---No Guidelines Guideline Title Affirmed Question Affirmed Notes Vomiting [1] MODERATE vomiting (e.g., 3 - 5 times/ day) AND [2] age > 24 Final Disposition User Go to ED Now (or PCP triage) Markus Daft, RN, Sherre Poot Comments Reports significant dizziness over last 2-3 wks and still feels this way now. RN made appt with Dr. Elease Hashimoto at 2:30 pm as Dr. Sarajane Jews did not have any openings. Referrals REFERRED TO PCP OFFICE Disagree/Comply: Comply

## 2016-11-23 NOTE — Telephone Encounter (Signed)
Noted  

## 2016-11-27 ENCOUNTER — Ambulatory Visit (INDEPENDENT_AMBULATORY_CARE_PROVIDER_SITE_OTHER): Payer: Medicare Other | Admitting: Family Medicine

## 2016-11-27 ENCOUNTER — Encounter: Payer: Self-pay | Admitting: Family Medicine

## 2016-11-27 VITALS — BP 148/98 | HR 86 | Temp 98.8°F | Ht 67.5 in | Wt 241.0 lb

## 2016-11-27 DIAGNOSIS — R531 Weakness: Secondary | ICD-10-CM | POA: Diagnosis not present

## 2016-11-27 DIAGNOSIS — R112 Nausea with vomiting, unspecified: Secondary | ICD-10-CM

## 2016-11-27 LAB — CBC WITH DIFFERENTIAL/PLATELET
BASOS ABS: 0 10*3/uL (ref 0.0–0.1)
BASOS PCT: 0.3 % (ref 0.0–3.0)
Eosinophils Absolute: 0.2 10*3/uL (ref 0.0–0.7)
Eosinophils Relative: 1.5 % (ref 0.0–5.0)
HEMATOCRIT: 45 % (ref 39.0–52.0)
HEMOGLOBIN: 14.6 g/dL (ref 13.0–17.0)
LYMPHS PCT: 26.9 % (ref 12.0–46.0)
Lymphs Abs: 2.9 10*3/uL (ref 0.7–4.0)
MCHC: 32.5 g/dL (ref 30.0–36.0)
MCV: 87.6 fl (ref 78.0–100.0)
MONO ABS: 1.3 10*3/uL — AB (ref 0.1–1.0)
Monocytes Relative: 12.1 % — ABNORMAL HIGH (ref 3.0–12.0)
Neutro Abs: 6.4 10*3/uL (ref 1.4–7.7)
Neutrophils Relative %: 59.2 % (ref 43.0–77.0)
Platelets: 395 10*3/uL (ref 150.0–400.0)
RBC: 5.14 Mil/uL (ref 4.22–5.81)
RDW: 13.8 % (ref 11.5–15.5)
WBC: 10.8 10*3/uL — AB (ref 4.0–10.5)

## 2016-11-27 LAB — BASIC METABOLIC PANEL
BUN: 7 mg/dL (ref 6–23)
CALCIUM: 9.9 mg/dL (ref 8.4–10.5)
CO2: 27 meq/L (ref 19–32)
CREATININE: 0.62 mg/dL (ref 0.40–1.50)
Chloride: 101 mEq/L (ref 96–112)
GFR: 135.79 mL/min (ref >60.00)
Glucose, Bld: 313 mg/dL — ABNORMAL HIGH (ref 70–99)
Potassium: 4.2 mEq/L (ref 3.5–5.1)
Sodium: 135 mEq/L (ref 135–145)

## 2016-11-27 LAB — HEPATIC FUNCTION PANEL
ALBUMIN: 3.8 g/dL (ref 3.5–5.2)
ALT: 23 U/L (ref 0–53)
AST: 16 U/L (ref 0–37)
Alkaline Phosphatase: 82 U/L (ref 39–117)
BILIRUBIN DIRECT: 0.1 mg/dL (ref 0.0–0.3)
TOTAL PROTEIN: 6.5 g/dL (ref 6.0–8.3)
Total Bilirubin: 0.4 mg/dL (ref 0.2–1.2)

## 2016-11-27 LAB — TSH: TSH: 0.82 u[IU]/mL (ref 0.35–4.50)

## 2016-11-27 NOTE — Progress Notes (Signed)
   Subjective:    Patient ID: [name], male    DOB: 05-20-1946, 71 y.o.   MRN: [id number]  HPI Here for several issues. First he has felt ill for several weeks now with fatigue, nausea and vomiting, and occasional diarrhea. No fever or abdominal pain. He was seen here 2 weeks ago for a groin abscess which was lanced. He took Doxycycline for 10 days and this seems to be healing well. His glucoses at home have been in the 200s and 300s. (he refuses to take any form of diabetes medication).   Review of Systems  Constitutional: Positive for fatigue. Negative for chills, diaphoresis and fever.  Respiratory: Negative.   Cardiovascular: Negative.   Gastrointestinal: Positive for diarrhea, nausea and vomiting. Negative for abdominal distention, abdominal pain, anal bleeding, blood in stool and constipation.  Genitourinary: Negative.   Neurological: Negative.        Objective:   Physical Exam  Constitutional: He is oriented to person, place, and time. He appears well-developed and well-nourished. No distress.  Cardiovascular: Normal rate, regular rhythm, normal heart sounds and intact distal pulses.   Pulmonary/Chest: Effort normal and breath sounds normal.  Abdominal: Soft. Bowel sounds are normal. He exhibits no distension and no mass. There is no tenderness. There is no rebound and no guarding.  Neurological: He is alert and oriented to person, place, and time.  Skin:  There is a remnant of the right groin abscess which is not fluctuant and not tender          Assessment & Plan:  Several weeks of fatigue and GI symptoms, possibly due to a viral illness. We will get labs today. His abscess seems to be healing well. He will see his urologist. Dr. Ky Barban, for a recheck later this week.  Alysia Penna, MD

## 2016-11-27 NOTE — Progress Notes (Signed)
Pre visit review using our clinic review tool, if applicable. No additional management support is needed unless otherwise documented below in the visit note. 

## 2016-11-30 DIAGNOSIS — R319 Hematuria, unspecified: Secondary | ICD-10-CM | POA: Diagnosis not present

## 2016-11-30 DIAGNOSIS — N401 Enlarged prostate with lower urinary tract symptoms: Secondary | ICD-10-CM | POA: Diagnosis not present

## 2016-12-10 ENCOUNTER — Other Ambulatory Visit: Payer: Self-pay | Admitting: Family Medicine

## 2016-12-11 DIAGNOSIS — R319 Hematuria, unspecified: Secondary | ICD-10-CM | POA: Diagnosis not present

## 2016-12-11 DIAGNOSIS — N401 Enlarged prostate with lower urinary tract symptoms: Secondary | ICD-10-CM | POA: Diagnosis not present

## 2016-12-25 ENCOUNTER — Encounter: Payer: Self-pay | Admitting: Family Medicine

## 2016-12-25 ENCOUNTER — Ambulatory Visit (INDEPENDENT_AMBULATORY_CARE_PROVIDER_SITE_OTHER): Payer: Medicare Other | Admitting: Family Medicine

## 2016-12-25 VITALS — BP 158/54 | HR 89 | Temp 98.1°F | Ht 67.5 in | Wt 245.2 lb

## 2016-12-25 DIAGNOSIS — N138 Other obstructive and reflux uropathy: Secondary | ICD-10-CM

## 2016-12-25 DIAGNOSIS — N401 Enlarged prostate with lower urinary tract symptoms: Secondary | ICD-10-CM

## 2016-12-25 DIAGNOSIS — E119 Type 2 diabetes mellitus without complications: Secondary | ICD-10-CM

## 2016-12-25 LAB — HEMOGLOBIN A1C: Hgb A1c MFr Bld: 14.3 % — ABNORMAL HIGH (ref 4.6–6.5)

## 2016-12-25 NOTE — Progress Notes (Signed)
Pre visit review using our clinic review tool, if applicable. No additional management support is needed unless otherwise documented below in the visit note. 

## 2016-12-25 NOTE — Progress Notes (Signed)
   Subjective:    Patient ID: [name], male    DOB: [birth year], 71 y.o.   MRN: [id number]  HPI Here to follow up on diabetes. As noted in the chart he had stopped all diabetic medications as of about 5 months ago. His last A1c in October was 14.5. He had been getting random glucoses at home in the range of 300 to 500. However at the urging of his family he decided to go back on insulin, and he has been taking this for the past 5 days. He is taking the last regimen he had been prescribed, which is Lantus 40 units at bedtime and Humalog 15 units bid. He feels better. He is scheduled for a TURP per Dr. Ky Barban on 01-02-17.    Review of Systems  Constitutional: Negative.   Respiratory: Negative.   Cardiovascular: Negative.   Gastrointestinal: Negative.   Genitourinary: Positive for difficulty urinating and frequency. Negative for dysuria.       Objective:   Physical Exam  Constitutional: He is oriented to person, place, and time. He appears well-developed and well-nourished.  Neck: No thyromegaly present.  Cardiovascular: Normal rate, regular rhythm, normal heart sounds and intact distal pulses.   Pulmonary/Chest: Effort normal and breath sounds normal.  Lymphadenopathy:    He has no cervical adenopathy.  Neurological: He is alert and oriented to person, place, and time.          Assessment & Plan:  He is taking insulin again for the diabetes and I urged him to stick with our plan. Check an A1c today. He will have the TURP as scheduled.  Alysia Penna, MD

## 2017-01-02 DIAGNOSIS — Z794 Long term (current) use of insulin: Secondary | ICD-10-CM | POA: Diagnosis not present

## 2017-01-02 DIAGNOSIS — N411 Chronic prostatitis: Secondary | ICD-10-CM | POA: Diagnosis not present

## 2017-01-02 DIAGNOSIS — Z885 Allergy status to narcotic agent status: Secondary | ICD-10-CM | POA: Diagnosis not present

## 2017-01-02 DIAGNOSIS — N410 Acute prostatitis: Secondary | ICD-10-CM | POA: Diagnosis not present

## 2017-01-02 DIAGNOSIS — N400 Enlarged prostate without lower urinary tract symptoms: Secondary | ICD-10-CM | POA: Diagnosis not present

## 2017-01-02 DIAGNOSIS — I10 Essential (primary) hypertension: Secondary | ICD-10-CM | POA: Diagnosis not present

## 2017-01-02 DIAGNOSIS — Z9049 Acquired absence of other specified parts of digestive tract: Secondary | ICD-10-CM | POA: Diagnosis not present

## 2017-01-02 DIAGNOSIS — Z87442 Personal history of urinary calculi: Secondary | ICD-10-CM | POA: Diagnosis not present

## 2017-01-02 DIAGNOSIS — Z79899 Other long term (current) drug therapy: Secondary | ICD-10-CM | POA: Diagnosis not present

## 2017-01-02 DIAGNOSIS — K219 Gastro-esophageal reflux disease without esophagitis: Secondary | ICD-10-CM | POA: Diagnosis not present

## 2017-01-02 DIAGNOSIS — F17200 Nicotine dependence, unspecified, uncomplicated: Secondary | ICD-10-CM | POA: Diagnosis not present

## 2017-01-02 DIAGNOSIS — E669 Obesity, unspecified: Secondary | ICD-10-CM | POA: Diagnosis not present

## 2017-01-02 DIAGNOSIS — E119 Type 2 diabetes mellitus without complications: Secondary | ICD-10-CM | POA: Diagnosis not present

## 2017-01-02 DIAGNOSIS — Z6838 Body mass index (BMI) 38.0-38.9, adult: Secondary | ICD-10-CM | POA: Diagnosis not present

## 2017-01-02 DIAGNOSIS — R319 Hematuria, unspecified: Secondary | ICD-10-CM | POA: Diagnosis not present

## 2017-01-03 DIAGNOSIS — R319 Hematuria, unspecified: Secondary | ICD-10-CM | POA: Diagnosis not present

## 2017-01-03 DIAGNOSIS — N400 Enlarged prostate without lower urinary tract symptoms: Secondary | ICD-10-CM | POA: Diagnosis not present

## 2017-01-03 DIAGNOSIS — N411 Chronic prostatitis: Secondary | ICD-10-CM | POA: Diagnosis not present

## 2017-01-03 DIAGNOSIS — E119 Type 2 diabetes mellitus without complications: Secondary | ICD-10-CM | POA: Diagnosis not present

## 2017-01-03 DIAGNOSIS — I10 Essential (primary) hypertension: Secondary | ICD-10-CM | POA: Diagnosis not present

## 2017-01-03 DIAGNOSIS — K219 Gastro-esophageal reflux disease without esophagitis: Secondary | ICD-10-CM | POA: Diagnosis not present

## 2017-01-23 ENCOUNTER — Ambulatory Visit: Payer: Self-pay | Admitting: Family Medicine

## 2017-01-24 DIAGNOSIS — N400 Enlarged prostate without lower urinary tract symptoms: Secondary | ICD-10-CM | POA: Diagnosis not present

## 2017-01-25 ENCOUNTER — Ambulatory Visit: Payer: Medicare Other | Admitting: Family Medicine

## 2017-01-30 ENCOUNTER — Other Ambulatory Visit: Payer: Self-pay | Admitting: Family Medicine

## 2017-01-30 NOTE — Telephone Encounter (Signed)
Refill request for pen needles and send to local [facility].

## 2017-02-04 MED ORDER — "PEN NEEDLES 5/16"" 31G X 8 MM MISC": 1 refills | Status: DC

## 2017-02-04 NOTE — Telephone Encounter (Signed)
I sent script [facility] to [facility]. 

## 2017-02-08 ENCOUNTER — Telehealth: Payer: Self-pay | Admitting: Family Medicine

## 2017-02-13 ENCOUNTER — Ambulatory Visit (INDEPENDENT_AMBULATORY_CARE_PROVIDER_SITE_OTHER): Payer: Medicare Other | Admitting: Family Medicine

## 2017-02-13 ENCOUNTER — Encounter: Payer: Self-pay | Admitting: Family Medicine

## 2017-02-13 VITALS — BP 138/96 | HR 97 | Temp 98.5°F | Ht 67.5 in | Wt 246.0 lb

## 2017-02-13 DIAGNOSIS — S86912A Strain of unspecified muscle(s) and tendon(s) at lower leg level, left leg, initial encounter: Secondary | ICD-10-CM

## 2017-02-13 DIAGNOSIS — E119 Type 2 diabetes mellitus without complications: Secondary | ICD-10-CM

## 2017-02-13 DIAGNOSIS — M25552 Pain in left hip: Secondary | ICD-10-CM

## 2017-02-13 DIAGNOSIS — M25551 Pain in right hip: Secondary | ICD-10-CM | POA: Diagnosis not present

## 2017-02-13 MED ORDER — DICLOFENAC SODIUM 75 MG PO TBEC: 75.0000 mg | DELAYED_RELEASE_TABLET | Freq: Two times a day (BID) | ORAL | 5 refills | Status: DC

## 2017-02-13 MED ORDER — METFORMIN HCL ER 500 MG PO TB24: 500.0000 mg | ORAL_TABLET | Freq: Every day | ORAL | 5 refills | Status: DC

## 2017-02-13 NOTE — Progress Notes (Signed)
Pre visit review using our clinic review tool, if applicable. No additional management support is needed unless otherwise documented below in the visit note. 

## 2017-02-13 NOTE — Progress Notes (Signed)
   Subjective:    Patient ID: [name], male    DOB: 08-19-1946, 71 y.o.   MRN: [id number]  HPI Here for several issues. First he has had some pain in both hips for several weeks. Ibuprofen helps. He used to walk every day for exercise but he stopped walking this past winter. He would like to get back into this if possible. Also he injured the left knee about 3 weeks ago. He was on his knees on the floor working beneath a kitchen sink when his left leg slipped out from under him laterally. He felt an immediate pain in the medial side of the left knee which has persisted. There was no swelling. No locking or giving way. Finally he wants to talk about his diabetes. He had been using the insulin we agreed on for a month or so but he stopped this due to the usual perceived side effects. He had stopped Metformin last year due to stomach cramps and diarrhea. He recently spoke to a friend that takes extended release Metformin, and this has reduced their GI side effects quite a bit. [name] would like to try this. His glucoses at home are better tan before, but they still run in the mid to high 200s. He has greatly reduced his intake of carbohydrates like bread and potatoes.    Review of Systems  Constitutional: Negative.   Respiratory: Negative.   Cardiovascular: Negative.   Musculoskeletal: Positive for arthralgias.       Objective:   Physical Exam  Constitutional: He appears well-developed and well-nourished.  Cardiovascular: Normal rate, regular rhythm, normal heart sounds and intact distal pulses.   Pulmonary/Chest: Effort normal and breath sounds normal.  Abdominal: Soft. Bowel sounds are normal. He exhibits no distension and no mass. There is no tenderness. There is no rebound and no guarding.  Musculoskeletal:  Both hips show full ROM and no tenderness. The left knee has no swelling but he is tender along the entire medial side of the knee, not so much in the joint space. Full ROM. Negative  anterior drawer.           Assessment & Plan:  For the diabetes, he will try Metformin ER 500 mg daily. 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 I encouraged him to walk daily because this will help him lose weight and the exercise should help the hip pain. He has a strain of the [facility] in the left knee. This should resolve in time. Use ice packs and take Advil prn.  Roger Penna, MD

## 2017-02-13 NOTE — Patient Instructions (Signed)
WE NOW OFFER   [facility] FAST TRACK!!!  SAME DAY Appointments for ACUTE CARE  Such as: Sprains, Injuries, cuts, abrasions, rashes, muscle pain, joint pain, back pain Colds, flu, sore throats, headache, allergies, cough, fever  Ear pain, sinus and eye infections Abdominal pain, nausea, vomiting, diarrhea, upset stomach Animal/insect bites  3 Easy Ways to Schedule: Walk-In Scheduling Call in scheduling Mychart Sign-up: [email or web address]         

## 2017-02-21 ENCOUNTER — Other Ambulatory Visit: Payer: Self-pay | Admitting: Family Medicine

## 2017-03-08 ENCOUNTER — Encounter: Payer: Self-pay | Admitting: Family Medicine

## 2017-03-08 ENCOUNTER — Ambulatory Visit (INDEPENDENT_AMBULATORY_CARE_PROVIDER_SITE_OTHER): Payer: Medicare Other | Admitting: Family Medicine

## 2017-03-08 VITALS — BP 152/98 | HR 88 | Temp 98.1°F | Wt 247.4 lb

## 2017-03-08 DIAGNOSIS — E119 Type 2 diabetes mellitus without complications: Secondary | ICD-10-CM

## 2017-03-08 DIAGNOSIS — M25562 Pain in left knee: Secondary | ICD-10-CM | POA: Diagnosis not present

## 2017-03-08 MED ORDER — CINNAMON 500 MG PO TABS: 1000.0000 mg | ORAL_TABLET | Freq: Three times a day (TID) | ORAL | 0 refills | Status: DC

## 2017-03-08 NOTE — Progress Notes (Signed)
Pre visit review using our clinic review tool, if applicable. No additional management support is needed unless otherwise documented below in the visit note. 

## 2017-03-08 NOTE — Patient Instructions (Signed)
WE NOW OFFER    [facility] FAST TRACK!!!  SAME DAY Appointments for ACUTE CARE  Such as: Sprains, Injuries, cuts, abrasions, rashes, muscle pain, joint pain, back pain Colds, flu, sore throats, headache, allergies, cough, fever  Ear pain, sinus and eye infections Abdominal pain, nausea, vomiting, diarrhea, upset stomach Animal/insect bites  3 Easy Ways to Schedule: Walk-In Scheduling Call in scheduling Mychart Sign-up: [email or web address]         

## 2017-03-08 NOTE — Progress Notes (Signed)
   Subjective:    Patient ID: [name], male    DOB: 1946-02-21, 71 y.o.   MRN: [id number]  HPI Here to follow up on diabetes. At our last visit we discussed his intolerance to Metformin and we tried Metformin ER. Unfortunately this also causes cramps and diarrhea so he stopped taking it after a week. Instead he is taking cinnamon tablets. Right now he takes this twice a day and he has droped his fasting glucoses 70 points (from the 390s to the 320s). He typically does not eat breakfast. His taking Diclofenac for joint pains and this has worked very well for him.    Review of Systems  Constitutional: Negative.   Respiratory: Negative.   Cardiovascular: Negative.   Musculoskeletal: Positive for arthralgias.  Neurological: Negative.        Objective:   Physical Exam  Constitutional: He is oriented to person, place, and time. He appears well-developed and well-nourished.  Cardiovascular: Normal rate, regular rhythm, normal heart sounds and intact distal pulses.   Pulmonary/Chest: Effort normal and breath sounds normal.  Neurological: He is alert and oriented to person, place, and time.          Assessment & Plan:  For the diabetes, he again is at the point of refusing to take any type of prescribed medications. He seems to be having a response to the cinnamon., so I encouraged him to continue with this. In fact I advised him to have a light breakfast every day (such as oatmeal) and he could take a dose of cinnamon with that (to give him 3 doses a day).  Alysia Penna, MD

## 2017-03-19 NOTE — Telephone Encounter (Signed)
Called to see if pt wanted to schedule awv - left message.  

## 2017-03-27 DIAGNOSIS — D1801 Hemangioma of skin and subcutaneous tissue: Secondary | ICD-10-CM | POA: Diagnosis not present

## 2017-03-27 DIAGNOSIS — L218 Other seborrheic dermatitis: Secondary | ICD-10-CM | POA: Diagnosis not present

## 2017-03-27 DIAGNOSIS — D485 Neoplasm of uncertain behavior of skin: Secondary | ICD-10-CM | POA: Diagnosis not present

## 2017-05-14 ENCOUNTER — Encounter: Payer: Self-pay | Admitting: Family Medicine

## 2017-05-14 ENCOUNTER — Ambulatory Visit (INDEPENDENT_AMBULATORY_CARE_PROVIDER_SITE_OTHER): Payer: Medicare Other | Admitting: Family Medicine

## 2017-05-14 VITALS — BP 150/98 | HR 93 | Temp 98.5°F | Ht 67.5 in | Wt 243.0 lb

## 2017-05-14 DIAGNOSIS — H9319 Tinnitus, unspecified ear: Secondary | ICD-10-CM | POA: Diagnosis not present

## 2017-05-14 DIAGNOSIS — H905 Unspecified sensorineural hearing loss: Secondary | ICD-10-CM | POA: Diagnosis not present

## 2017-05-14 DIAGNOSIS — E119 Type 2 diabetes mellitus without complications: Secondary | ICD-10-CM

## 2017-05-14 DIAGNOSIS — H9190 Unspecified hearing loss, unspecified ear: Secondary | ICD-10-CM | POA: Insufficient documentation

## 2017-05-14 NOTE — Progress Notes (Signed)
   Subjective:    Patient ID: [name], male    DOB: 10-24-1946, 71 y.o.   MRN: [id number]  HPI Here to ask for a referral to the [facility]. He has worked with Deatra Ina PhD and he is thinking about getting cochlear implants. His hearing continues to decline and he has constant tinnitus. He also plans to see the [facility] for his diabetes soon.    Review of Systems  Constitutional: Negative.   HENT: Positive for hearing loss.   Respiratory: Negative.   Cardiovascular: Negative.        Objective:   Physical Exam  Constitutional: He is oriented to person, place, and time. He appears well-developed and well-nourished.  Cardiovascular: Normal rate, regular rhythm, normal heart sounds and intact distal pulses.   Pulmonary/Chest: Effort normal and breath sounds normal.  Neurological: He is alert and oriented to person, place, and time.          Assessment & Plan:  Hearing loss. We will refer him back to the [facility] clinic as above.  Alysia Penna, MD

## 2017-05-14 NOTE — Patient Instructions (Signed)
WE NOW OFFER   [facility] FAST TRACK!!!  SAME DAY Appointments for ACUTE CARE  Such as: Sprains, Injuries, cuts, abrasions, rashes, muscle pain, joint pain, back pain Colds, flu, sore throats, headache, allergies, cough, fever  Ear pain, sinus and eye infections Abdominal pain, nausea, vomiting, diarrhea, upset stomach Animal/insect bites  3 Easy Ways to Schedule: Walk-In Scheduling Call in scheduling Mychart Sign-up: [email or web address]         

## 2017-05-23 DIAGNOSIS — H903 Sensorineural hearing loss, bilateral: Secondary | ICD-10-CM | POA: Diagnosis not present

## 2017-05-27 ENCOUNTER — Other Ambulatory Visit: Payer: Self-pay

## 2017-05-27 ENCOUNTER — Ambulatory Visit (INDEPENDENT_AMBULATORY_CARE_PROVIDER_SITE_OTHER): Payer: Medicare Other | Admitting: Family Medicine

## 2017-05-27 ENCOUNTER — Encounter: Payer: Self-pay | Admitting: Family Medicine

## 2017-05-27 VITALS — BP 150/98 | HR 94 | Temp 98.3°F | Ht 67.5 in | Wt 243.0 lb

## 2017-05-27 DIAGNOSIS — H6123 Impacted cerumen, bilateral: Secondary | ICD-10-CM

## 2017-05-27 NOTE — Progress Notes (Signed)
   Subjective:    Patient ID: [name], male    DOB: 1946-06-19, 71 y.o.   MRN: [id number]  HPI Here for ear cerumen. He recently saw a hearing specialist for testing and was told his ears were full. He has hearing loss but no discomfort.    Review of Systems  Constitutional: Negative.   HENT: Positive for hearing loss. Negative for congestion, ear discharge, ear pain, postnasal drip, sinus pain and sinus pressure.   Eyes: Negative.   Respiratory: Negative.        Objective:   Physical Exam  Constitutional: He appears well-developed and well-nourished.  HENT:  Nose: Nose normal.  Mouth/Throat: Oropharynx is clear and moist.  Both ears are full of cerumen   Eyes: Conjunctivae are normal.  Neck: Neck supple. No thyromegaly present.  Pulmonary/Chest: Effort normal and breath sounds normal. No respiratory distress. He has no wheezes. He has no rales.  Lymphadenopathy:    He has no cervical adenopathy.          Assessment & Plan:  Cerumen impactions. We attempted to irrigate these but were not successful. We will refer to ENT for this.  Alysia Penna, MD

## 2017-05-27 NOTE — Patient Instructions (Signed)
WE NOW OFFER    [facility] FAST TRACK!!!  SAME DAY Appointments for ACUTE CARE  Such as: Sprains, Injuries, cuts, abrasions, rashes, muscle pain, joint pain, back pain Colds, flu, sore throats, headache, allergies, cough, fever  Ear pain, sinus and eye infections Abdominal pain, nausea, vomiting, diarrhea, upset stomach Animal/insect bites  3 Easy Ways to Schedule: Walk-In Scheduling Call in scheduling Mychart Sign-up: [email or web address]         

## 2017-06-18 DIAGNOSIS — H9313 Tinnitus, bilateral: Secondary | ICD-10-CM | POA: Diagnosis not present

## 2017-06-18 DIAGNOSIS — H6123 Impacted cerumen, bilateral: Secondary | ICD-10-CM | POA: Diagnosis not present

## 2017-06-18 DIAGNOSIS — Z87891 Personal history of nicotine dependence: Secondary | ICD-10-CM | POA: Diagnosis not present

## 2017-06-18 DIAGNOSIS — J343 Hypertrophy of nasal turbinates: Secondary | ICD-10-CM | POA: Diagnosis not present

## 2017-06-18 DIAGNOSIS — H903 Sensorineural hearing loss, bilateral: Secondary | ICD-10-CM | POA: Diagnosis not present

## 2017-06-19 DIAGNOSIS — H903 Sensorineural hearing loss, bilateral: Secondary | ICD-10-CM | POA: Diagnosis not present

## 2017-07-15 ENCOUNTER — Ambulatory Visit: Payer: Self-pay | Admitting: Family Medicine

## 2017-07-25 ENCOUNTER — Encounter: Payer: Self-pay | Admitting: Family Medicine

## 2017-07-25 ENCOUNTER — Ambulatory Visit (INDEPENDENT_AMBULATORY_CARE_PROVIDER_SITE_OTHER): Payer: Medicare Other | Admitting: Family Medicine

## 2017-07-25 VITALS — BP 119/87 | HR 95 | Temp 98.3°F | Ht 67.5 in | Wt 237.0 lb

## 2017-07-25 DIAGNOSIS — R5382 Chronic fatigue, unspecified: Secondary | ICD-10-CM | POA: Diagnosis not present

## 2017-07-25 DIAGNOSIS — I10 Essential (primary) hypertension: Secondary | ICD-10-CM

## 2017-07-25 DIAGNOSIS — G4733 Obstructive sleep apnea (adult) (pediatric): Secondary | ICD-10-CM

## 2017-07-25 DIAGNOSIS — E119 Type 2 diabetes mellitus without complications: Secondary | ICD-10-CM | POA: Diagnosis not present

## 2017-07-25 LAB — CBC WITH DIFFERENTIAL/PLATELET
Basophils Absolute: 0.1 10*3/uL (ref 0.0–0.1)
Basophils Relative: 0.8 % (ref 0.0–3.0)
EOS PCT: 3 % (ref 0.0–5.0)
Eosinophils Absolute: 0.3 10*3/uL (ref 0.0–0.7)
HCT: 44.7 % (ref 39.0–52.0)
Hemoglobin: 14.6 g/dL (ref 13.0–17.0)
LYMPHS ABS: 2.6 10*3/uL (ref 0.7–4.0)
Lymphocytes Relative: 30.4 % (ref 12.0–46.0)
MCHC: 32.6 g/dL (ref 30.0–36.0)
MCV: 90.4 fl (ref 78.0–100.0)
Monocytes Absolute: 0.9 10*3/uL (ref 0.1–1.0)
Monocytes Relative: 10.2 % (ref 3.0–12.0)
NEUTROS PCT: 55.6 % (ref 43.0–77.0)
Neutro Abs: 4.7 10*3/uL (ref 1.4–7.7)
Platelets: 363 10*3/uL (ref 150.0–400.0)
RBC: 4.95 Mil/uL (ref 4.22–5.81)
RDW: 13.5 % (ref 11.5–15.5)
WBC: 8.4 10*3/uL (ref 4.0–10.5)

## 2017-07-25 LAB — HEMOGLOBIN A1C

## 2017-07-25 LAB — BASIC METABOLIC PANEL
BUN: 20 mg/dL (ref 6–23)
CHLORIDE: 100 meq/L (ref 96–112)
CO2: 24 mEq/L (ref 19–32)
CREATININE: 0.83 mg/dL (ref 0.40–1.50)
Calcium: 10.5 mg/dL (ref 8.4–10.5)
GFR: 96.8 mL/min (ref >60.00)
GLUCOSE: 406 mg/dL — AB (ref 70–99)
POTASSIUM: 4.6 meq/L (ref 3.5–5.1)
Sodium: 132 mEq/L — ABNORMAL LOW (ref 135–145)

## 2017-07-25 LAB — HEPATIC FUNCTION PANEL
ALT: 13 U/L (ref 0–53)
AST: 11 U/L (ref 0–37)
Albumin: 4 g/dL (ref 3.5–5.2)
Alkaline Phosphatase: 73 U/L (ref 39–117)
BILIRUBIN TOTAL: 0.3 mg/dL (ref 0.2–1.2)
Bilirubin, Direct: 0.1 mg/dL (ref 0.0–0.3)
TOTAL PROTEIN: 6.6 g/dL (ref 6.0–8.3)

## 2017-07-25 LAB — TSH: TSH: 0.82 u[IU]/mL (ref 0.35–4.50)

## 2017-07-25 LAB — VITAMIN B12: Vitamin B-12: 414 pg/mL (ref 211–911)

## 2017-07-25 NOTE — Patient Instructions (Signed)
WE NOW OFFER   [facility] FAST TRACK!!!  SAME DAY Appointments for ACUTE CARE  Such as: Sprains, Injuries, cuts, abrasions, rashes, muscle pain, joint pain, back pain Colds, flu, sore throats, headache, allergies, cough, fever  Ear pain, sinus and eye infections Abdominal pain, nausea, vomiting, diarrhea, upset stomach Animal/insect bites  3 Easy Ways to Schedule: Walk-In Scheduling Call in scheduling Mychart Sign-up: [email or web address]         

## 2017-07-25 NOTE — Progress Notes (Signed)
   Subjective:    Patient ID: [name], male    DOB: 11/13/1945, 71 y.o.   MRN: [id number]  HPI Here for several issues. First he has felt a generalized fatigue and excessive sleepiness for the past 6 months, and it seems to be getting worse. He feels he sleeps well at night. His wife says he snores loudly, but she has never mentioned that he stops breathing. He says he feels the need to sit down or lie down after minimal exertion. No chest pain or SOB. No fever. He drinks plenty of water daily. He does mention some intermittent numbness and tingling in both feet and toes. He had labs drawn in January and February which were normal except for poorly controlled diabetes. His BP is stable.    Review of Systems  Constitutional: Positive for fatigue. Negative for activity change, appetite change, fever and unexpected weight change.  Respiratory: Negative.   Cardiovascular: Negative.   Gastrointestinal: Negative.   Genitourinary: Negative.   Neurological: Negative.        Objective:   Physical Exam  Constitutional: He is oriented to person, place, and time. He appears well-developed and well-nourished. No distress.  Neck: No thyromegaly present.  Cardiovascular: Normal rate, regular rhythm, normal heart sounds and intact distal pulses.   Pulmonary/Chest: Effort normal and breath sounds normal. No respiratory distress. He has no wheezes. He has no rales.  Abdominal: Soft. Bowel sounds are normal. He exhibits no distension and no mass. There is no tenderness. There is no rebound and no guarding.  Musculoskeletal: He exhibits no edema.  Lymphadenopathy:    He has no cervical adenopathy.  Neurological: He is alert and oriented to person, place, and time.          Assessment & Plan:  Fatigue and excessive sleepiness. I think it is likely that he has obstructive sleep apnea. We will refer him to be evaluated with a sleep study. Check an A1c today and a B12 level for the neuropathy in his  feet.  Alysia Penna, MD

## 2017-08-06 ENCOUNTER — Telehealth: Payer: Self-pay | Admitting: *Deleted

## 2017-08-06 DIAGNOSIS — R42 Dizziness and giddiness: Secondary | ICD-10-CM

## 2017-08-06 NOTE — Telephone Encounter (Signed)
Patient called requesting answers to his questions from the mychart report messages he received. Please advise [phone]

## 2017-08-07 ENCOUNTER — Telehealth: Payer: Self-pay | Admitting: *Deleted

## 2017-08-07 NOTE — Telephone Encounter (Signed)
See previous phone message I was unable to attach to that message. Patient is calling to follow up with the message he left yesterday

## 2017-08-07 NOTE — Telephone Encounter (Signed)
We did a CT of the head on 09-11-17 and this was normal. However he may benefit from seeing Neurology. I can do a referral if he wishes

## 2017-08-07 NOTE — Telephone Encounter (Signed)
I spoke with pt and went over recent lab results. Pt would like to know why he is feeling so dizzy & tired all the time, he says this all started after his fall in November and hitting his head. Please advise.

## 2017-08-08 ENCOUNTER — Encounter: Payer: Self-pay | Admitting: Neurology

## 2017-08-08 NOTE — Telephone Encounter (Signed)
I spoke with pt and yes he would like the referral.

## 2017-08-08 NOTE — Telephone Encounter (Signed)
This is a duplicate note, see previous one.  

## 2017-08-08 NOTE — Telephone Encounter (Signed)
The referral was done  

## 2017-08-08 NOTE — Addendum Note (Signed)
Addended by: Alysia Penna A on: 08/08/2017 08:45 AM   Modules accepted: Orders

## 2017-09-04 ENCOUNTER — Ambulatory Visit (INDEPENDENT_AMBULATORY_CARE_PROVIDER_SITE_OTHER): Payer: Medicare Other | Admitting: Family Medicine

## 2017-09-04 ENCOUNTER — Encounter: Payer: Self-pay | Admitting: Family Medicine

## 2017-09-04 VITALS — BP 130/88 | Temp 98.3°F | Ht 67.5 in | Wt 244.0 lb

## 2017-09-04 DIAGNOSIS — N138 Other obstructive and reflux uropathy: Secondary | ICD-10-CM | POA: Diagnosis not present

## 2017-09-04 DIAGNOSIS — E782 Mixed hyperlipidemia: Secondary | ICD-10-CM | POA: Diagnosis not present

## 2017-09-04 DIAGNOSIS — N401 Enlarged prostate with lower urinary tract symptoms: Secondary | ICD-10-CM | POA: Diagnosis not present

## 2017-09-04 DIAGNOSIS — I10 Essential (primary) hypertension: Secondary | ICD-10-CM

## 2017-09-04 DIAGNOSIS — E119 Type 2 diabetes mellitus without complications: Secondary | ICD-10-CM

## 2017-09-04 LAB — LIPID PANEL
Cholesterol: 237 mg/dL — ABNORMAL HIGH (ref 0–200)
HDL: 39.8 mg/dL (ref >39.00)
LDL CALC: 169 mg/dL — AB (ref 0–99)
NONHDL: 197.69
Total CHOL/HDL Ratio: 6
Triglycerides: 143 mg/dL (ref 0.0–149.0)
VLDL: 28.6 mg/dL (ref 0.0–40.0)

## 2017-09-04 LAB — PSA: PSA: 2.79 ng/mL (ref 0.10–4.00)

## 2017-09-04 NOTE — Patient Instructions (Signed)
WE NOW OFFER   [facility] FAST TRACK!!!  SAME DAY Appointments for ACUTE CARE  Such as: Sprains, Injuries, cuts, abrasions, rashes, muscle pain, joint pain, back pain Colds, flu, sore throats, headache, allergies, cough, fever  Ear pain, sinus and eye infections Abdominal pain, nausea, vomiting, diarrhea, upset stomach Animal/insect bites  3 Easy Ways to Schedule: Walk-In Scheduling Call in scheduling Mychart Sign-up: [email or web address]         

## 2017-09-04 NOTE — Progress Notes (Addendum)
   Subjective:    Patient ID: [name], male    DOB: Apr 22, 1946, 71 y.o.   MRN: [id number]  HPI Here to follow up on several issues. His diabetes remains out of control, and his A1c last month was 14.3. He has started walking again now that the weather outside is cooler. He feels good in general. His BP is stable. He is fasting today. He also mentions trouble with both ankles. He has always had "weak" ankles and he tends to twist them frequently. They cause him a lot of pain off and on.    Review of Systems  Constitutional: Negative.   Respiratory: Negative.   Cardiovascular: Negative.   Endocrine: Negative.        Objective:   Physical Exam  Constitutional: He appears well-developed and well-nourished.  Neck: No thyromegaly present.  Cardiovascular: Normal rate, regular rhythm, normal heart sounds and intact distal pulses.   Pulmonary/Chest: Effort normal and breath sounds normal. No respiratory distress. He has no wheezes. He has no rales.  Lymphadenopathy:    He has no cervical adenopathy.          Assessment & Plan:  His HTN is stable. We will check his lipids today since he is fasting. I encouraged him to exercise daily. He has changed his mind about seeing an Musician. His friend since one in the [facility] system and he will find out who this is. We will arrange for him to get some braces to support his ankles.  Alysia Penna, MD

## 2017-09-13 ENCOUNTER — Telehealth: Payer: Self-pay | Admitting: Family Medicine

## 2017-09-13 NOTE — Telephone Encounter (Signed)
° ° ° °  [name] with [employer] call to say they received approval a ankle brace and they call to say they need office notes to support that why he need the brace    Fax [phone]

## 2017-09-16 NOTE — Telephone Encounter (Signed)
Office notes was faxed to below number.

## 2017-09-16 NOTE — Telephone Encounter (Signed)
I printed out a copy of our last OV documenting the ankle problems.

## 2017-09-16 NOTE — Telephone Encounter (Signed)
Not sure when this was discussed during a office visit.

## 2017-09-25 ENCOUNTER — Other Ambulatory Visit: Payer: Self-pay | Admitting: Family Medicine

## 2017-10-02 ENCOUNTER — Ambulatory Visit: Payer: Medicare Other | Admitting: Pulmonary Disease

## 2017-10-02 ENCOUNTER — Encounter: Payer: Self-pay | Admitting: Pulmonary Disease

## 2017-10-02 VITALS — BP 118/80 | HR 97 | Ht 68.0 in | Wt 236.2 lb

## 2017-10-02 DIAGNOSIS — R29818 Other symptoms and signs involving the nervous system: Secondary | ICD-10-CM | POA: Diagnosis not present

## 2017-10-02 DIAGNOSIS — Z72821 Inadequate sleep hygiene: Secondary | ICD-10-CM

## 2017-10-02 MED ORDER — NYSTATIN 100000 UNIT/ML MT SUSP: 500000.0000 [IU] | Freq: Four times a day (QID) | OROMUCOSAL | 0 refills | Status: DC

## 2017-10-02 NOTE — Progress Notes (Signed)
   Subjective:    Patient ID: [name], male    DOB: [birth year], 71 y.o.   MRN: [id number]  HPI    Review of Systems  Constitutional: Negative for fever and unexpected weight change.  HENT: Positive for postnasal drip and sore throat. Negative for congestion, dental problem, ear pain, nosebleeds, rhinorrhea, sinus pressure, sneezing and trouble swallowing.   Eyes: Negative for redness and itching.  Respiratory: Positive for cough. Negative for chest tightness, shortness of breath and wheezing.   Cardiovascular: Negative for palpitations and leg swelling.  Gastrointestinal: Negative for nausea and vomiting.  Genitourinary: Negative for dysuria.  Musculoskeletal: Negative for joint swelling.  Skin: Negative for rash.  Allergic/Immunologic: Negative.  Negative for environmental allergies, food allergies and immunocompromised state.  Neurological: Positive for headaches.  Hematological: Does not bruise/bleed easily.  Psychiatric/Behavioral: Negative for dysphoric mood. The patient is not nervous/anxious.        Objective:   Physical Exam        Assessment & Plan:

## 2017-10-02 NOTE — Patient Instructions (Signed)
Will arrange for home sleep study Will call to arrange for follow up after sleep study reviewed  

## 2017-10-02 NOTE — Progress Notes (Signed)
Past Surgical History He  has a past surgical history that includes Cholecystectomy; Transurethral resection of prostate (January 2010); Hernia repair; and Colonoscopy (09-14-08).  Allergies  Allergen Reactions  . Codeine Phosphate     REACTION: vomiting    Family History His family history includes Depression in his unknown relative.  Social History He  reports that he quit smoking about 8 years ago. His smoking use included cigarettes. He has a 7.50 pack-year smoking history. he has never used smokeless tobacco. He reports that he does not drink alcohol or use drugs.  Review of systems Constitutional: Negative for fever and unexpected weight change.  HENT: Positive for postnasal drip and sore throat. Negative for congestion, dental problem, ear pain, nosebleeds, rhinorrhea, sinus pressure, sneezing and trouble swallowing.   Eyes: Negative for redness and itching.  Respiratory: Positive for cough. Negative for chest tightness, shortness of breath and wheezing.   Cardiovascular: Negative for palpitations and leg swelling.  Gastrointestinal: Negative for nausea and vomiting.  Genitourinary: Negative for dysuria.  Musculoskeletal: Negative for joint swelling.  Skin: Negative for rash.  Allergic/Immunologic: Negative.  Negative for environmental allergies, food allergies and immunocompromised state.  Neurological: Positive for headaches.  Hematological: Does not bruise/bleed easily.  Psychiatric/Behavioral: Negative for dysphoric mood. The patient is not nervous/anxious.     Current Outpatient Medications on File Prior to Visit  Medication Sig  . Cinnamon 500 MG TABS Take 2 tablets (1,000 mg total) by mouth 3 (three) times daily.  Marland Kitchen losartan-hydrochlorothiazide (HYZAAR) 50-12.5 MG tablet TAKE 1 TABLET DAILY  . omeprazole (PRILOSEC) 20 MG capsule TAKE 1 CAPSULE DAILY  . [DISCONTINUED] lisinopril-hydrochlorothiazide (PRINZIDE,ZESTORETIC) 20-25 MG per tablet Take 1 tablet by mouth  daily.   No current facility-administered medications on file prior to visit.     Chief Complaint  Patient presents with  . sleep consult    pt referred by Dr. Alysia Penna MD. 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 Pt wakes up in middle of night takes few hours get back to sleep and will sleep until around lunch time.    Past medical history He  has a past medical history of Allergy, BPH (benign prostatic hyperplasia), CAD (coronary artery disease), Depression, Diabetes mellitus, Elevated PSA, GERD (gastroesophageal reflux disease), Hip pain, bilateral, Hyperlipidemia, Hypertension, Hypogonadism male, and Insomnia.  Vital signs BP 118/80 (BP Location: Left Arm, Cuff Size: Normal)   Pulse 97   Ht 5\' 8"  (1.727 m)   Wt 236 lb 3.2 oz (107.1 kg)   SpO2 95%   BMI 35.91 kg/m   History of Present Illness [name] is a 71 y.o. male for evaluation of sleep problems.  He has noticed trouble with his sleep for years.  He worked in the [employer] trade [employer] for years.  He sleep pattern was unpredictable.  This has persisted even after retirement.  His wife also says he snores.  He will stop breathing at night.  His mouth gets dry.  He talks in his sleep.  He goes to bed at 1130 pm.  He sleeps for 3 hours then wakes up.  He uses the bathroom frequently at night.  He stays awake until about 5 am.  He falls back to sleep until 8 am.  He the falls back to sleep after eating lunch.  He can then sleep for about 2 to 3 hours.  If he doesn't keep himself busy, he can fall asleep easily.  He is not using anything to help sleep or stay  awake.  He leaves his TV on for the entire time he is asleep.  He denies sleep walking, bruxism, or nightmares.  There is no history of restless legs.  He denies sleep hallucinations, sleep paralysis, or cataplexy.  The Epworth score is 17 out of 24.   Physical Exam:  General - No distress ENT - No sinus tenderness, no oral exudate, no LAN, no thyromegaly, TM  clear, pupils equal/reactive, white exudate in posterior pharynx Cardiac - s1s2 regular, no murmur, pulses symmetric Chest - No wheeze/rales/dullness, good air entry, normal respiratory excursion Back - No focal tenderness Abd - Soft, non-tender, no organomegaly, + bowel sounds Ext - No edema Neuro - Normal strength, cranial nerves intact Skin - No rashes Psych - Normal mood, and behavior  Discussion: He has snoring, sleep disruption, witnessed apnea, and daytime sleepiness.  He has hx of CAD, depression, DM, HTN.  I am concerned he could have sleep apnea.  We discussed how sleep apnea can affect various health problems, including risks for hypertension, cardiovascular disease, and diabetes.  We also discussed how sleep disruption can increase risks for accidents, such as while driving.  Weight loss as a means of improving sleep apnea was also reviewed.  Additional treatment options discussed were CPAP therapy, oral appliance, and surgical intervention.  Assessment/plan:  Suspected sleep apnea. - will arrange for home sleep study  Poor sleep hygiene. - will address further after review of his sleep study  [facility]. - will give him course of nystatin   Patient Instructions  Will arrange for home sleep study Will call to arrange for follow up after sleep study reviewed     Chesley Mires, M.D. Pager [phone] 10/02/2017, 2:38 PM

## 2017-10-14 ENCOUNTER — Ambulatory Visit: Payer: Self-pay | Admitting: Family Medicine

## 2017-10-15 ENCOUNTER — Telehealth: Payer: Self-pay | Admitting: *Deleted

## 2017-10-15 NOTE — Telephone Encounter (Signed)
Copied from [place] [id number]. Topic: Inquiry >> Oct 15, 2017 10:26 AM Oliver Pila B wrote: Reason for CRM: pt called to get a referral to a G.I. Physician, contact pt to advise, was looking for an available appt for his schedule but currently couldn't find one

## 2017-10-16 ENCOUNTER — Encounter: Payer: Self-pay | Admitting: *Deleted

## 2017-10-16 NOTE — Telephone Encounter (Signed)
I would be happy to, but I need a diagnosis. Why does he want to see them? He will not be due for a colonoscopy until Nov 2019

## 2017-10-16 NOTE — Telephone Encounter (Signed)
Called pt and left a VM to call back. CRM created.  

## 2017-10-16 NOTE — Telephone Encounter (Signed)
Sent to PCP ?

## 2017-10-18 ENCOUNTER — Telehealth: Payer: Self-pay | Admitting: Pulmonary Disease

## 2017-10-18 NOTE — Telephone Encounter (Signed)
rec'd email today from Nadine in [facility] dept that Order was placed for patient to have home sleep study. She  called him today to schedule his appt & he states he has decided he does not want to have study. Judeen Hammans told him I would make vs and myself is aware.   Routing message to VS for review as well.

## 2017-10-18 NOTE — Telephone Encounter (Signed)
Called pt and left a message to call back.

## 2017-10-18 NOTE — Telephone Encounter (Signed)
Noted  

## 2017-10-22 ENCOUNTER — Ambulatory Visit: Payer: Self-pay | Admitting: Family Medicine

## 2017-10-22 NOTE — Telephone Encounter (Signed)
I sent pt a my chart message with this information, he can give [place] a call back if he needs anything further.

## 2017-11-19 ENCOUNTER — Ambulatory Visit: Payer: Self-pay | Admitting: Neurology

## 2017-11-22 ENCOUNTER — Telehealth: Payer: Self-pay | Admitting: Family Medicine

## 2017-11-22 NOTE — Telephone Encounter (Signed)
Insurance had started to deny coverage for EKGs so this was a change made last year.   Pt advised and voiced understanding.

## 2017-11-22 NOTE — Telephone Encounter (Signed)
Copied from [place]. Topic: General - Other >> Nov 22, 2017 10:13 AM Marin Olp L wrote: Reason for CRM: Patient would like call back to inquire about not getting an EKG on his physicals exams? His last one was 09/04/2017. Please advise.

## 2017-11-29 DIAGNOSIS — K582 Mixed irritable bowel syndrome: Secondary | ICD-10-CM | POA: Diagnosis not present

## 2017-11-29 DIAGNOSIS — E1165 Type 2 diabetes mellitus with hyperglycemia: Secondary | ICD-10-CM | POA: Diagnosis not present

## 2017-11-29 DIAGNOSIS — K219 Gastro-esophageal reflux disease without esophagitis: Secondary | ICD-10-CM | POA: Diagnosis not present

## 2017-11-29 DIAGNOSIS — R112 Nausea with vomiting, unspecified: Secondary | ICD-10-CM | POA: Diagnosis not present

## 2017-12-16 DIAGNOSIS — K2950 Unspecified chronic gastritis without bleeding: Secondary | ICD-10-CM | POA: Diagnosis not present

## 2017-12-16 DIAGNOSIS — Z1211 Encounter for screening for malignant neoplasm of colon: Secondary | ICD-10-CM | POA: Diagnosis not present

## 2017-12-16 DIAGNOSIS — K6389 Other specified diseases of intestine: Secondary | ICD-10-CM | POA: Diagnosis not present

## 2017-12-16 DIAGNOSIS — R112 Nausea with vomiting, unspecified: Secondary | ICD-10-CM | POA: Diagnosis not present

## 2017-12-16 DIAGNOSIS — K3189 Other diseases of stomach and duodenum: Secondary | ICD-10-CM | POA: Diagnosis not present

## 2017-12-16 DIAGNOSIS — D120 Benign neoplasm of cecum: Secondary | ICD-10-CM | POA: Diagnosis not present

## 2017-12-16 HISTORY — PX: ESOPHAGOGASTRODUODENOSCOPY: SHX1529

## 2017-12-16 HISTORY — PX: COLONOSCOPY: SHX174

## 2018-01-15 ENCOUNTER — Ambulatory Visit (INDEPENDENT_AMBULATORY_CARE_PROVIDER_SITE_OTHER): Payer: Medicare Other | Admitting: Family Medicine

## 2018-01-15 ENCOUNTER — Encounter: Payer: Self-pay | Admitting: Family Medicine

## 2018-01-15 VITALS — BP 140/98 | HR 104 | Temp 98.2°F | Wt 227.0 lb

## 2018-01-15 DIAGNOSIS — M159 Polyosteoarthritis, unspecified: Secondary | ICD-10-CM

## 2018-01-15 DIAGNOSIS — E119 Type 2 diabetes mellitus without complications: Secondary | ICD-10-CM

## 2018-01-15 DIAGNOSIS — M1990 Unspecified osteoarthritis, unspecified site: Secondary | ICD-10-CM | POA: Insufficient documentation

## 2018-01-15 DIAGNOSIS — G629 Polyneuropathy, unspecified: Secondary | ICD-10-CM

## 2018-01-15 DIAGNOSIS — R0781 Pleurodynia: Secondary | ICD-10-CM | POA: Diagnosis not present

## 2018-01-15 DIAGNOSIS — M150 Primary generalized (osteo)arthritis: Secondary | ICD-10-CM | POA: Diagnosis not present

## 2018-01-15 LAB — POCT GLYCOSYLATED HEMOGLOBIN (HGB A1C): Hemoglobin A1C: 12.7

## 2018-01-15 MED ORDER — NAPROXEN 500 MG PO TABS: 500.0000 mg | ORAL_TABLET | Freq: Two times a day (BID) | ORAL | 3 refills | Status: DC

## 2018-01-15 MED ORDER — LOSARTAN POTASSIUM-HCTZ 50-12.5 MG PO TABS: 1.0000 | ORAL_TABLET | Freq: Every day | ORAL | 3 refills | Status: DC

## 2018-01-15 NOTE — Progress Notes (Signed)
   Subjective:    Patient ID: [name], male    DOB: 15-Apr-1946, 72 y.o.   MRN: [id number]  HPI Here for several issues. First he was lying on the floor 3 days ago and his wife was was on his back giving him a massage. He felt a pop and felt a sharp pain in the left anterior ribs. He has felt this pain ever since although it is slowly improving. No SOB. He also mentions 3 months of intermittent numbness and sometimes burning pains in the right lower leg and right foot. No color changes or swelling. As noted before he took himself off all diabetic medications and he is trying to manage this with diet alone. His last A1c was 14.3 last September. His fasting glucoses have been averaging 200-250 lately. Finally he has had diffuse achy pains in a lot of joints including hips and knees for about 6 months. He has tried Diclofenac and Ibuprofen with mixed results. No joint swelling.    Review of Systems  Constitutional: Negative.   Respiratory: Negative.   Cardiovascular: Positive for chest pain. Negative for palpitations and leg swelling.  Neurological: Positive for numbness.       Objective:   Physical Exam  Constitutional: He appears well-developed and well-nourished.  Neck: No thyromegaly present.  Cardiovascular: Normal rate, regular rhythm, normal heart sounds and intact distal pulses.  Pulmonary/Chest: Effort normal and breath sounds normal. No respiratory distress. He has no wheezes. He has no rales.  Mildly tender in the left lower anterior ribs. No crepitus   Musculoskeletal: He exhibits no edema.  Lymphadenopathy:    He has no cervical adenopathy.  Neurological: No cranial nerve deficit. He exhibits normal muscle tone. Coordination normal.          Assessment & Plan:  His diabetes is poorly controlled as usual. He is experiencing neuropathy in the leg and foot, and certainly this is from the diabetes. I again stressed that he should resume his medications but he again  declined. He does plan to make an appt with an Endocrinologist in the [place] system soon. As for the rib injury this is either a cracked rib or a separated rib. He declines getting an Xray of it. Either way this will heal with time. Also he has arthritis pains and he will try Naproxen 500 mg bid as needed.  He can return as needed.  Alysia Penna, MD

## 2018-01-24 ENCOUNTER — Encounter: Payer: Self-pay | Admitting: Family Medicine

## 2018-01-24 ENCOUNTER — Ambulatory Visit: Payer: Medicare Other | Admitting: Family Medicine

## 2018-01-24 VITALS — BP 142/90 | HR 100 | Temp 98.2°F | Ht 68.0 in | Wt 227.4 lb

## 2018-01-24 DIAGNOSIS — K5900 Constipation, unspecified: Secondary | ICD-10-CM | POA: Diagnosis not present

## 2018-01-24 MED ORDER — POLYETHYLENE GLYCOL 3350 17 GM/SCOOP PO POWD: 17.0000 g | Freq: Two times a day (BID) | ORAL | 1 refills | Status: DC | PRN

## 2018-01-24 MED ORDER — DOCUSATE SODIUM 100 MG PO CAPS: 100.0000 mg | ORAL_CAPSULE | Freq: Two times a day (BID) | ORAL | 2 refills | Status: DC

## 2018-01-24 NOTE — Progress Notes (Signed)
   Subjective:    Patient ID: [name], male    DOB: 1946/01/23, 72 y.o.   MRN: [id number]  HPI Here for 2 months of intermittent abdominal pain and bloating. No fever or nausea. He had upper and lower endoscopies last month showing gastritis, duodenitis, and a polyp. He has been using Metamucil daily for one week. He says his stools are large but not hard, and he has averaged only one BM every 5 days.    Review of Systems  Constitutional: Negative.   Respiratory: Negative.   Cardiovascular: Negative.   Gastrointestinal: Positive for abdominal distention, abdominal pain and constipation. Negative for anal bleeding, blood in stool, diarrhea, nausea, rectal pain and vomiting.  Genitourinary: Negative.        Objective:   Physical Exam  Constitutional: He appears well-developed and well-nourished. No distress.  Cardiovascular: Normal rate, regular rhythm, normal heart sounds and intact distal pulses.  Pulmonary/Chest: Effort normal and breath sounds normal. No respiratory distress. He has no wheezes. He has no rales.  Abdominal: Soft. Bowel sounds are normal. He exhibits no distension and no mass. There is no rebound and no guarding.  Mild generalized tenderness           Assessment & Plan:  Constipation. He will switch from Metamucil to Miralax and he will use this twice a day. Add Colace 100 mg BID. Recheck prn.  Alysia Penna, MD

## 2018-02-05 ENCOUNTER — Ambulatory Visit: Payer: Medicare Other | Admitting: Family Medicine

## 2018-02-05 ENCOUNTER — Encounter: Payer: Self-pay | Admitting: Family Medicine

## 2018-02-05 VITALS — BP 140/90 | HR 104 | Temp 98.2°F | Ht 68.0 in | Wt 224.0 lb

## 2018-02-05 DIAGNOSIS — R109 Unspecified abdominal pain: Secondary | ICD-10-CM | POA: Diagnosis not present

## 2018-02-05 NOTE — Progress Notes (Signed)
   Subjective:    Patient ID: [name], male    DOB: October 26, 1946, 72 y.o.   MRN: [id number]  HPI Here to discuss ongoing abdominal complaints. For the past month he has had bloating and pain in the lower abdomen along with constipation. He saw [place] 2 weeks ago and we suggested he try Miralax. This has helped his stools to be softer and he passes his stools more easily, but he still has the bloating. Now he also complains of epigastric pains that radiates around to both flanks. No nausea or fever. He had upper and lower endoscopy2 months ago per Dr. Shary Key at [facility], and these were mostly unremarkable. He had a CT scan on 11-09-17 that was unremarkable. He is still using Miralax.   Review of Systems  Constitutional: Negative.   Respiratory: Negative.   Cardiovascular: Negative.   Gastrointestinal: Positive for abdominal distention and abdominal pain. Negative for anal bleeding, blood in stool, constipation, diarrhea, nausea and rectal pain.  Genitourinary: Negative.        Objective:   Physical Exam  Constitutional: He is oriented to person, place, and time. He appears well-developed.  Cardiovascular: Normal rate, regular rhythm, normal heart sounds and intact distal pulses.  Pulmonary/Chest: Effort normal and breath sounds normal. No respiratory distress. He has no wheezes. He has no rales.  Abdominal: Soft. Bowel sounds are normal. He exhibits no distension and no mass. There is no tenderness. There is no rebound and no guarding.  Neurological: He is alert and oriented to person, place, and time.          Assessment & Plan:  Abdominal pains of uncertain etiology. I suggested he see Dr. Shary Key again soon.  Alysia Penna, MD

## 2018-02-25 ENCOUNTER — Encounter: Payer: Self-pay | Admitting: Family Medicine

## 2018-02-25 ENCOUNTER — Ambulatory Visit: Payer: Medicare Other | Admitting: Family Medicine

## 2018-02-25 VITALS — BP 140/90 | HR 95 | Temp 98.4°F | Ht 68.0 in | Wt 219.2 lb

## 2018-02-25 DIAGNOSIS — K859 Acute pancreatitis without necrosis or infection, unspecified: Secondary | ICD-10-CM

## 2018-02-25 DIAGNOSIS — R634 Abnormal weight loss: Secondary | ICD-10-CM

## 2018-02-25 DIAGNOSIS — R1084 Generalized abdominal pain: Secondary | ICD-10-CM

## 2018-02-25 DIAGNOSIS — R531 Weakness: Secondary | ICD-10-CM | POA: Diagnosis not present

## 2018-02-25 LAB — BASIC METABOLIC PANEL
BUN: 10 mg/dL (ref 6–23)
CO2: 28 meq/L (ref 19–32)
Calcium: 10.6 mg/dL — ABNORMAL HIGH (ref 8.4–10.5)
Chloride: 103 mEq/L (ref 96–112)
Creatinine, Ser: 0.64 mg/dL (ref 0.40–1.50)
GFR: 130.44 mL/min (ref >60.00)
GLUCOSE: 226 mg/dL — AB (ref 70–99)
POTASSIUM: 4.8 meq/L (ref 3.5–5.1)
Sodium: 135 mEq/L (ref 135–145)

## 2018-02-25 LAB — CBC WITH DIFFERENTIAL/PLATELET
BASOS ABS: 0.1 10*3/uL (ref 0.0–0.1)
BASOS PCT: 0.7 % (ref 0.0–3.0)
EOS ABS: 0.3 10*3/uL (ref 0.0–0.7)
Eosinophils Relative: 2.6 % (ref 0.0–5.0)
HCT: 42.6 % (ref 39.0–52.0)
Hemoglobin: 14 g/dL (ref 13.0–17.0)
LYMPHS ABS: 3.6 10*3/uL (ref 0.7–4.0)
Lymphocytes Relative: 29.6 % (ref 12.0–46.0)
MCHC: 32.9 g/dL (ref 30.0–36.0)
MCV: 83.8 fl (ref 78.0–100.0)
MONO ABS: 1.2 10*3/uL — AB (ref 0.1–1.0)
Monocytes Relative: 9.8 % (ref 3.0–12.0)
NEUTROS ABS: 7 10*3/uL (ref 1.4–7.7)
NEUTROS PCT: 57.3 % (ref 43.0–77.0)
PLATELETS: 399 10*3/uL (ref 150.0–400.0)
RBC: 5.09 Mil/uL (ref 4.22–5.81)
RDW: 16 % — AB (ref 11.5–15.5)
WBC: 12.2 10*3/uL — ABNORMAL HIGH (ref 4.0–10.5)

## 2018-02-25 LAB — AMYLASE: AMYLASE: 36 U/L (ref 27–131)

## 2018-02-25 LAB — HEPATIC FUNCTION PANEL
ALBUMIN: 4.1 g/dL (ref 3.5–5.2)
ALT: 17 U/L (ref 0–53)
AST: 17 U/L (ref 0–37)
Alkaline Phosphatase: 93 U/L (ref 39–117)
Bilirubin, Direct: 0.1 mg/dL (ref 0.0–0.3)
TOTAL PROTEIN: 6.5 g/dL (ref 6.0–8.3)
Total Bilirubin: 0.4 mg/dL (ref 0.2–1.2)

## 2018-02-25 LAB — LIPASE: Lipase: 67 U/L — ABNORMAL HIGH (ref 11.0–59.0)

## 2018-02-25 LAB — TSH: TSH: 0.93 u[IU]/mL (ref 0.35–4.50)

## 2018-02-25 MED ORDER — GABAPENTIN 100 MG PO CAPS: 100.0000 mg | ORAL_CAPSULE | Freq: Three times a day (TID) | ORAL | 3 refills | Status: DC

## 2018-02-25 NOTE — Progress Notes (Signed)
   Subjective:    Patient ID: [name], male    DOB: Nov 20, 1945, 72 y.o.   MRN: [id number]  HPI duplicate   Review of Systems     Objective:   Physical Exam        Assessment & Plan:

## 2018-02-25 NOTE — Progress Notes (Signed)
   Subjective:    Patient ID: [name], male    DOB: 02-Feb-1946, 72 y.o.   MRN: [id number]  HPI Here to follow up on generalized abdominal pains. He has been having 3-4 soft BMs a day now that he is taking Miralax, but the pain is no better. He feels tired a lot and his appetite is down. He has lost about 30 lbs since last October.    Review of Systems  Constitutional: Positive for appetite change, fatigue and unexpected weight change. Negative for fever.  Respiratory: Negative.   Gastrointestinal: Positive for abdominal pain. Negative for abdominal distention, anal bleeding, blood in stool, constipation, diarrhea, nausea, rectal pain and vomiting.  Genitourinary: Negative.        Objective:   Physical Exam  Constitutional: He is oriented to person, place, and time. He appears well-developed and well-nourished. No distress.  Cardiovascular: Normal rate, regular rhythm, normal heart sounds and intact distal pulses.  Pulmonary/Chest: Effort normal and breath sounds normal. No respiratory distress. He has no wheezes. He has no rales.  Abdominal: Soft. Bowel sounds are normal. He exhibits no distension and no mass. There is no tenderness. There is no rebound and no guarding.  Neurological: He is alert and oriented to person, place, and time.          Assessment & Plan:  Abdominal pain, fatigue, and weight loss. His pain may be due to some autonomic neuropathy. He has had neuropathy in the feet for years. Try Gabapentin 100 mg tid. Get some labs today. If he is not feeling better by next week, I suggested he follow up with Dr. Shary Key [facility]).  Alysia Penna, MD

## 2018-03-10 ENCOUNTER — Telehealth: Payer: Self-pay | Admitting: Family Medicine

## 2018-03-10 NOTE — Telephone Encounter (Signed)
Called pt and left a VM to call back to go over lab results. CRM created and sent to [facility] pool.  

## 2018-03-10 NOTE — Telephone Encounter (Signed)
Copied from [place] [phone] [phone]. Topic: Quick Communication - See Telephone Encounter >> Mar 10, 2018  8:19 AM Robina Ade, Helene Kelp D wrote: CRM for notification. See Telephone encounter for: 03/10/18. Patient calling and is retuning Dr. Sarajane Jews CMA phone call from Friday, please call patient back, thanks.

## 2018-03-11 NOTE — Addendum Note (Signed)
Addended by: Alysia Penna A on: 03/11/2018 01:28 PM   Modules accepted: Orders

## 2018-03-19 ENCOUNTER — Other Ambulatory Visit: Payer: Self-pay | Admitting: Family Medicine

## 2018-03-19 NOTE — Telephone Encounter (Signed)
Last OV 02/25/18, No Future OV  Last filled 02/25/18, # [phone] with 3 refills  Patient/Pharmacy requesting # [phone] with 3 refills  Please advise.

## 2018-03-21 DIAGNOSIS — R1084 Generalized abdominal pain: Secondary | ICD-10-CM | POA: Diagnosis not present

## 2018-03-21 DIAGNOSIS — E11649 Type 2 diabetes mellitus with hypoglycemia without coma: Secondary | ICD-10-CM | POA: Diagnosis not present

## 2018-03-21 DIAGNOSIS — K5900 Constipation, unspecified: Secondary | ICD-10-CM | POA: Diagnosis not present

## 2018-03-21 DIAGNOSIS — R112 Nausea with vomiting, unspecified: Secondary | ICD-10-CM | POA: Diagnosis not present

## 2018-04-08 ENCOUNTER — Encounter: Payer: Self-pay | Admitting: Family Medicine

## 2018-04-08 ENCOUNTER — Ambulatory Visit (INDEPENDENT_AMBULATORY_CARE_PROVIDER_SITE_OTHER): Payer: Medicare Other | Admitting: Family Medicine

## 2018-04-08 ENCOUNTER — Ambulatory Visit: Payer: Self-pay | Admitting: Family Medicine

## 2018-04-08 VITALS — BP 140/90 | HR 97 | Temp 98.4°F | Ht 68.0 in | Wt 219.2 lb

## 2018-04-08 DIAGNOSIS — E119 Type 2 diabetes mellitus without complications: Secondary | ICD-10-CM | POA: Diagnosis not present

## 2018-04-08 DIAGNOSIS — G629 Polyneuropathy, unspecified: Secondary | ICD-10-CM

## 2018-04-08 LAB — HEMOGLOBIN A1C: HEMOGLOBIN A1C: 11.2 % — AB (ref 4.6–6.5)

## 2018-04-08 MED ORDER — DICYCLOMINE HCL 20 MG PO TABS: 20.0000 mg | ORAL_TABLET | Freq: Three times a day (TID) | ORAL | 0 refills | Status: DC

## 2018-04-08 MED ORDER — GABAPENTIN 300 MG PO CAPS: ORAL_CAPSULE | ORAL | 3 refills | Status: DC

## 2018-04-08 MED ORDER — LINACLOTIDE 145 MCG PO CAPS: 145.0000 ug | ORAL_CAPSULE | Freq: Every day | ORAL | 0 refills | Status: AC

## 2018-04-08 NOTE — Progress Notes (Signed)
   Subjective:    Patient ID: Denson Niccoli, male    DOB: 02-09-1946, 72 y.o.   MRN: [id number]  HPI Here for continued body aches in the arms, legs, and trunk as well as numbness in the arms and legs. He has been taking Gabapentin with no response. He saw [facility] recently for constipation and they started him on Linzess. His appetite has improved and he has gained a little weight.    Review of Systems  Constitutional: Negative.   Respiratory: Negative.   Cardiovascular: Negative.   Gastrointestinal: Positive for abdominal pain and constipation. Negative for abdominal distention, anal bleeding, blood in stool, diarrhea, nausea, rectal pain and vomiting.  Musculoskeletal: Positive for myalgias.  Neurological: Positive for numbness. Negative for weakness.       Objective:   Physical Exam  Constitutional: He is oriented to person, place, and time. He appears well-developed and well-nourished.  Cardiovascular: Normal rate, regular rhythm, normal heart sounds and intact distal pulses.  Pulmonary/Chest: Effort normal and breath sounds normal.  Neurological: He is alert and oriented to person, place, and time.          Assessment & Plan:  For the diabetes, we will check an A1c today. For the neuropathy we will increase the gabapentin to 300 mg in the morning and midday, then take 600 mg at night.  Alysia Penna, MD

## 2018-04-15 DIAGNOSIS — R1084 Generalized abdominal pain: Secondary | ICD-10-CM | POA: Diagnosis not present

## 2018-04-15 DIAGNOSIS — K5900 Constipation, unspecified: Secondary | ICD-10-CM | POA: Diagnosis not present

## 2018-04-15 DIAGNOSIS — E1165 Type 2 diabetes mellitus with hyperglycemia: Secondary | ICD-10-CM | POA: Diagnosis not present

## 2018-04-17 ENCOUNTER — Encounter: Payer: Self-pay | Admitting: Family Medicine

## 2018-04-17 ENCOUNTER — Ambulatory Visit (INDEPENDENT_AMBULATORY_CARE_PROVIDER_SITE_OTHER): Payer: Medicare Other | Admitting: Family Medicine

## 2018-04-17 VITALS — BP 130/76 | HR 90 | Temp 98.4°F | Ht 68.0 in | Wt 218.6 lb

## 2018-04-17 DIAGNOSIS — I10 Essential (primary) hypertension: Secondary | ICD-10-CM | POA: Diagnosis not present

## 2018-04-17 DIAGNOSIS — R1084 Generalized abdominal pain: Secondary | ICD-10-CM

## 2018-04-17 DIAGNOSIS — G629 Polyneuropathy, unspecified: Secondary | ICD-10-CM

## 2018-04-17 DIAGNOSIS — E119 Type 2 diabetes mellitus without complications: Secondary | ICD-10-CM

## 2018-04-17 MED ORDER — ERTUGLIFLOZIN-SITAGLIPTIN 5-100 MG PO TABS: 1.0000 | ORAL_TABLET | Freq: Every day | ORAL | 5 refills | Status: DC

## 2018-04-17 NOTE — Progress Notes (Signed)
   Subjective:    Patient ID: [name], male    DOB: [birth year], 72 y.o.   MRN: [id number]  HPI Here to follow up on diabetic neuropathy and abdominal pain. At our last visit on 04-08-18 we increased the Gabapentin to a total of 1200 mg a day. Since then his legs have improved tremendously. They have less burning and less pain. His abdominal pain has also improved quite a bit. He saw Dr. Shary Key, his [facility] specialist, on 04-15-18, and he felt that most of [name]'s abdominal pain is from neuropathy. He is having regular stools since taking Linzess, and the generalized burning pains have resolved. His appetite has even picked up. Dr. Shary Key encouraged him to follow up with [place] to work on his diabetes more diligently.so far [name] has tried Metformin, Glipizide, Humalog, Lantus, and Levemir, but all of these made him "feel bad" in various ways. Therefore he has refused to take any diabetic medications for months. However he has now changed his mind and he asks to try something new that he has never tried before. Of note his last A1c was 11.2 and most of the random glucoses he gets at home are in the 300s.    Review of Systems  Constitutional: Negative.   Respiratory: Negative.   Cardiovascular: Negative.   Neurological: Positive for numbness.       Objective:   Physical Exam  Constitutional: He is oriented to person, place, and time. He appears well-developed and well-nourished.  Cardiovascular: Normal rate, regular rhythm, normal heart sounds and intact distal pulses.  Pulmonary/Chest: Effort normal and breath sounds normal.  Abdominal: Soft. Bowel sounds are normal. He exhibits no distension and no mass. There is no tenderness. There is no rebound and no guarding. No hernia.  Neurological: He is alert and oriented to person, place, and time.          Assessment & Plan:  His constipation has improved with Linzess. His abdominal pains and his leg pains are all due to diabetic neuropathy,  and this has improved with the higher doses of Gabapentin. For the diabetes we agreed to try some oral medications that he has not tried yet. I gave him a coupon to start on Stelglujan 5/100 to take once a day. Recheck in 3 weeks. Alysia Penna, MD

## 2018-04-23 ENCOUNTER — Telehealth: Payer: Self-pay

## 2018-04-23 NOTE — Telephone Encounter (Signed)
Fax from [place] for [employer] for [employer] on [address]

## 2018-04-25 DIAGNOSIS — K5900 Constipation, unspecified: Secondary | ICD-10-CM | POA: Diagnosis not present

## 2018-04-25 DIAGNOSIS — K7689 Other specified diseases of liver: Secondary | ICD-10-CM | POA: Diagnosis not present

## 2018-04-25 DIAGNOSIS — R1084 Generalized abdominal pain: Secondary | ICD-10-CM | POA: Diagnosis not present

## 2018-04-25 DIAGNOSIS — R932 Abnormal findings on diagnostic imaging of liver and biliary tract: Secondary | ICD-10-CM | POA: Diagnosis not present

## 2018-04-25 DIAGNOSIS — K5790 Diverticulosis of intestine, part unspecified, without perforation or abscess without bleeding: Secondary | ICD-10-CM | POA: Diagnosis not present

## 2018-04-25 NOTE — Telephone Encounter (Signed)
PA for Steglujan initiated via CoverMyMeds. Awaiting determination. Key: [id number]

## 2018-05-01 ENCOUNTER — Other Ambulatory Visit: Payer: Self-pay | Admitting: Family Medicine

## 2018-05-02 ENCOUNTER — Telehealth: Payer: Self-pay | Admitting: Family Medicine

## 2018-05-02 ENCOUNTER — Telehealth: Payer: Self-pay

## 2018-05-02 NOTE — Telephone Encounter (Signed)
Sent to PCP to advise 

## 2018-05-02 NOTE — Telephone Encounter (Signed)
Copied from [place] [id number]. Topic: Quick Communication - See Telephone Encounter >> May 02, 2018  9:16 AM Bea Graff, NT wrote: CRM for notification. See Telephone encounter for: 05/02/18. Pt would like to not take the losartan anymore and would like Dr. Sarajane Jews to prescribe something else due to losartan recalls.

## 2018-05-02 NOTE — Telephone Encounter (Signed)
Prior autherization request   Medication Stegluan 5-100 MG   Fax from [place] at [employer]

## 2018-05-05 NOTE — Telephone Encounter (Signed)
Prior auth for Steglujan 5/100 sent to [email or web address].

## 2018-05-05 NOTE — Telephone Encounter (Signed)
Stop Losartan HCT and switch to Valsartan HCT 40-12.5 to take daily, call in #90 with 3 rf

## 2018-05-06 ENCOUNTER — Other Ambulatory Visit: Payer: Self-pay

## 2018-05-06 MED ORDER — VALSARTAN-HYDROCHLOROTHIAZIDE 80-12.5 MG PO TABS: 1.0000 | ORAL_TABLET | Freq: Every day | ORAL | 3 refills | Status: DC

## 2018-05-06 NOTE — Telephone Encounter (Signed)
Dr. Sarajane Jews did you want 60-12.5 or 180-12.5 ?  I did NOT see 40-12.5 dose

## 2018-05-06 NOTE — Telephone Encounter (Signed)
Rx has been sent. I made a mistake there is no 60-12.5 only 180-12.5 and 80-12.5   Dr. Sarajane Jews gave me the verbal OK for 80-12.5 MG   Rx has been sent.

## 2018-05-06 NOTE — Telephone Encounter (Signed)
I meant to send in the 60-12.5 dose, thanks

## 2018-05-12 ENCOUNTER — Ambulatory Visit (INDEPENDENT_AMBULATORY_CARE_PROVIDER_SITE_OTHER): Payer: Medicare Other | Admitting: Family Medicine

## 2018-05-12 ENCOUNTER — Encounter: Payer: Self-pay | Admitting: Family Medicine

## 2018-05-12 ENCOUNTER — Telehealth: Payer: Self-pay

## 2018-05-12 ENCOUNTER — Other Ambulatory Visit: Payer: Self-pay

## 2018-05-12 VITALS — BP 132/90 | HR 99 | Temp 98.5°F | Ht 68.0 in | Wt 226.8 lb

## 2018-05-12 DIAGNOSIS — M25561 Pain in right knee: Secondary | ICD-10-CM | POA: Diagnosis not present

## 2018-05-12 DIAGNOSIS — I779 Disorder of arteries and arterioles, unspecified: Secondary | ICD-10-CM

## 2018-05-12 DIAGNOSIS — M25559 Pain in unspecified hip: Secondary | ICD-10-CM | POA: Diagnosis not present

## 2018-05-12 DIAGNOSIS — M810 Age-related osteoporosis without current pathological fracture: Secondary | ICD-10-CM | POA: Diagnosis not present

## 2018-05-12 DIAGNOSIS — G629 Polyneuropathy, unspecified: Secondary | ICD-10-CM

## 2018-05-12 DIAGNOSIS — M25562 Pain in left knee: Secondary | ICD-10-CM | POA: Diagnosis not present

## 2018-05-12 DIAGNOSIS — I739 Peripheral vascular disease, unspecified: Secondary | ICD-10-CM

## 2018-05-12 MED ORDER — MELOXICAM 15 MG PO TABS: 15.0000 mg | ORAL_TABLET | Freq: Every day | ORAL | 5 refills | Status: DC

## 2018-05-12 NOTE — Telephone Encounter (Signed)
Prior Authorization  Steglujan    Once done please fax   Call pt once this has been done

## 2018-05-12 NOTE — Telephone Encounter (Signed)
Prior autherization   Send to second insurance   Medication Steglujan   please fax once done  Pt would like a call once this has been completed.   Sent to [employer] form placed at desk.

## 2018-05-12 NOTE — Telephone Encounter (Signed)
Prior Authorization  send to secondary insurance [employer] plan  Medication: [facility]  Once done please FAX  Call pt once this has been completed.   Sent to [employer] placed on desk.

## 2018-05-12 NOTE — Progress Notes (Signed)
   Subjective:    Patient ID: [name], male    DOB: October 10, 1946, 72 y.o.   MRN: [id number]  HPI Here to follow up on neuropathy of the legs and feet and of the abdominal cavity. We recently increase the Gabapentin to a total of 400 mg daily and this has helped somewhat. He has less abdominal pain and bloating. The burning pains and numbness in the legs has improved. He has had more arthritis  pains however in the hips and knees lately. He is trying to exercise by walking but he can only walk for about 15 minutes before he has to stop due to pain. He has been taking Naprosyn 500 mg bid. Also he had a health screening done in 2013 which showed some mild plaque buildup in both carotid arteries and some mild osteoporosis. These have not been followed up on since then and he admits he never told me about these.    Review of Systems  Constitutional: Negative.   Respiratory: Negative.   Cardiovascular: Negative.   Gastrointestinal: Positive for abdominal distention and abdominal pain. Negative for anal bleeding, blood in stool, constipation, diarrhea, nausea and vomiting.  Musculoskeletal: Positive for arthralgias.  Neurological: Positive for numbness.       Objective:   Physical Exam  Constitutional: He is oriented to person, place, and time. He appears well-developed and well-nourished.  Cardiovascular: Normal rate, regular rhythm, normal heart sounds and intact distal pulses.  Pulmonary/Chest: Effort normal and breath sounds normal. No stridor. No respiratory distress. He has no wheezes. He has no rales.  Abdominal: Soft. Bowel sounds are normal. He exhibits no distension and no mass. There is no tenderness. There is no rebound and no guarding. No hernia.  Neurological: He is alert and oriented to person, place, and time.          Assessment & Plan:  For the neuropathy, increase the Gabapentin to a total of 600 mg a day (take two 200 mg capsules TID). For the arthritis, stop Naprosyn  and try Meloxicam 15 m g daily. We will set up to do follow up carotid dopplers and DEXA soon.  Alysia Penna, MD

## 2018-05-14 ENCOUNTER — Ambulatory Visit (INDEPENDENT_AMBULATORY_CARE_PROVIDER_SITE_OTHER)
Admission: RE | Admit: 2018-05-14 | Discharge: 2018-05-14 | Disposition: A | Discharge status: Home / Self Care | Payer: Medicare Other | Source: Ambulatory Visit | Attending: Family Medicine | Admitting: Family Medicine

## 2018-05-14 DIAGNOSIS — M810 Age-related osteoporosis without current pathological fracture: Secondary | ICD-10-CM | POA: Diagnosis not present

## 2018-05-16 ENCOUNTER — Ambulatory Visit (HOSPITAL_COMMUNITY)
Admission: RE | Admit: 2018-05-16 | Discharge: 2018-05-16 | Disposition: A | Discharge status: Home / Self Care | Payer: Medicare Other | Source: Ambulatory Visit | Attending: Cardiology | Admitting: Cardiology

## 2018-05-16 DIAGNOSIS — I779 Disorder of arteries and arterioles, unspecified: Secondary | ICD-10-CM | POA: Diagnosis not present

## 2018-05-16 DIAGNOSIS — I739 Peripheral vascular disease, unspecified: Secondary | ICD-10-CM

## 2018-05-19 NOTE — Telephone Encounter (Signed)
Forms completed and faxed to [employer] at [phone].

## 2018-05-19 NOTE — Telephone Encounter (Signed)
See prior note

## 2018-05-20 NOTE — Telephone Encounter (Signed)
Called and spoke with pt. Pt advised and voiced understanding.  

## 2018-05-20 NOTE — Telephone Encounter (Signed)
Fax received from [place] stating the request was approved.  I called the pharmacy, informed Maudie Mercury of this and she stated she will inform the pt.

## 2018-05-20 NOTE — Telephone Encounter (Signed)
Called pt and went to VM. Will call pt back later.

## 2018-05-20 NOTE — Telephone Encounter (Signed)
Called [facility] to check on status of PA per request of patient/CMA.  PA has been received and determination is pending. Typical turnaround time is 24-72 hours. Our office will be notified by phone call or fax once determination is made.

## 2018-05-22 ENCOUNTER — Telehealth: Payer: Self-pay | Admitting: Family Medicine

## 2018-05-22 NOTE — Telephone Encounter (Signed)
Copied from [place] [id number]. Topic: Quick Communication - See Telephone Encounter >> May 22, 2018 12:20 PM Hewitt Shorts wrote: Pt is needing a refill on gabapentin and was told by the provider that next refill he request the provider is planning on increasing the mg  Best number [phone]

## 2018-05-23 MED ORDER — GABAPENTIN 300 MG PO CAPS: 600.0000 mg | ORAL_CAPSULE | Freq: Three times a day (TID) | ORAL | 1 refills | Status: DC

## 2018-05-23 NOTE — Telephone Encounter (Signed)
Call in Gabapentin 300 mg to take 2 capsules TID (for 6 a day), #540 with one rf

## 2018-05-23 NOTE — Telephone Encounter (Signed)
Please advise 

## 2018-05-23 NOTE — Telephone Encounter (Signed)
Sent to PCP to advise 

## 2018-05-23 NOTE — Telephone Encounter (Signed)
Rx has been sent  

## 2018-06-04 ENCOUNTER — Ambulatory Visit: Payer: Medicare Other | Admitting: Family Medicine

## 2018-06-04 ENCOUNTER — Encounter: Payer: Self-pay | Admitting: Family Medicine

## 2018-06-04 VITALS — BP 128/76 | HR 92 | Temp 98.5°F | Ht 68.0 in | Wt 233.6 lb

## 2018-06-04 DIAGNOSIS — I10 Essential (primary) hypertension: Secondary | ICD-10-CM | POA: Diagnosis not present

## 2018-06-04 DIAGNOSIS — E1140 Type 2 diabetes mellitus with diabetic neuropathy, unspecified: Secondary | ICD-10-CM

## 2018-06-04 DIAGNOSIS — G629 Polyneuropathy, unspecified: Secondary | ICD-10-CM | POA: Diagnosis not present

## 2018-06-04 MED ORDER — GABAPENTIN 300 MG PO CAPS: ORAL_CAPSULE | ORAL | 5 refills | Status: DC

## 2018-06-04 MED ORDER — TADALAFIL 5 MG PO TABS: 5.0000 mg | ORAL_TABLET | Freq: Every day | ORAL | 11 refills | Status: DC

## 2018-06-04 NOTE — Progress Notes (Signed)
   Subjective:    Patient ID: [name], male    DOB: February 05, 1946, 72 y.o.   MRN: [id number]  HPI Here to follow up. He has been taking Steglujan for the past month and he feels much better. The numbness and pain in his legs has improved quite a bit. His am fasting glucoses have come down from 250-280 to the range of 150-180. His BP is stable. He wants to increase the Gabapentin a bit to help the abdominal pains. He also wants to get back on Cialis 5 mg daily.    Review of Systems  Constitutional: Negative.   Respiratory: Negative.   Cardiovascular: Negative.   Gastrointestinal: Positive for abdominal pain. Negative for constipation, diarrhea, nausea and vomiting.  Neurological: Positive for numbness.       Objective:   Physical Exam  Constitutional: He is oriented to person, place, and time. He appears well-developed and well-nourished.  Cardiovascular: Normal rate, regular rhythm, normal heart sounds and intact distal pulses.  Pulmonary/Chest: Effort normal and breath sounds normal.  Neurological: He is alert and oriented to person, place, and time.          Assessment & Plan:  His HTN is stable. The diabetes is much better controlled now though not ideal. He will continue the [place] and his dietary efforts. We wil increase the Gabapentin to a total of 8 300 mg capsules a day.  Alysia Penna, MD

## 2018-06-30 ENCOUNTER — Ambulatory Visit (INDEPENDENT_AMBULATORY_CARE_PROVIDER_SITE_OTHER): Payer: Medicare Other | Admitting: Family Medicine

## 2018-06-30 ENCOUNTER — Encounter: Payer: Self-pay | Admitting: Family Medicine

## 2018-06-30 VITALS — BP 100/66 | HR 98 | Temp 98.5°F | Ht 68.0 in | Wt 232.6 lb

## 2018-06-30 DIAGNOSIS — G629 Polyneuropathy, unspecified: Secondary | ICD-10-CM

## 2018-06-30 DIAGNOSIS — E1140 Type 2 diabetes mellitus with diabetic neuropathy, unspecified: Secondary | ICD-10-CM | POA: Diagnosis not present

## 2018-06-30 NOTE — Progress Notes (Signed)
   Subjective:    Patient ID: [name], male    DOB: Nov 13, 1945, 72 y.o.   MRN: [id number]  HPI Here to discuss worsening burning and numbness in the feet and also in the abdomen. He has been taking Neurontin and at first it helped both of these issues. However in the past month they seem to have gotten worse again. His am glucoses have been averaging 140-160.   Review of Systems  Constitutional: Negative.   Respiratory: Negative.   Cardiovascular: Negative.   Gastrointestinal: Positive for abdominal pain. Negative for constipation and diarrhea.  Neurological: Positive for numbness.       Objective:   Physical Exam  Constitutional: He is oriented to person, place, and time. He appears well-developed and well-nourished.  Cardiovascular: Normal rate, regular rhythm, normal heart sounds and intact distal pulses.  Pulmonary/Chest: Effort normal and breath sounds normal. No stridor. No respiratory distress. He has no wheezes. He has no rales.  Neurological: He is alert and oriented to person, place, and time.          Assessment & Plan:  His diabetic neuropathy is a bit worse, so we will increase the Gabapentin to take 3 capsules in the mornings, 3 in the afternoons, and 6 at night. He will report back in 3-4 weeks.  Alysia Penna, MD

## 2018-07-09 ENCOUNTER — Telehealth: Payer: Self-pay

## 2018-07-09 NOTE — Telephone Encounter (Signed)
Pt requests new RX: Pt looking for new Rx for gabapentin with new direction of 3 each AM,# at noon, and 6 at bedtime  Sent to PCP to advise  Last OV 06/30/2018   Current Rx directions are the following: Take 2 capsules in the morning, 2 at noon, and 4 at bedtime

## 2018-07-09 NOTE — Telephone Encounter (Signed)
Call in Gabapentin 300 mg to take 3 in morning, 2 at noon, and 6 at bedtime, #330 with 5 rf

## 2018-07-10 MED ORDER — GABAPENTIN 300 MG PO CAPS: ORAL_CAPSULE | ORAL | 5 refills | Status: DC

## 2018-07-10 NOTE — Addendum Note (Signed)
Addended by: Myriam Forehand on: 07/10/2018 08:55 AM   Modules accepted: Orders

## 2018-07-10 NOTE — Telephone Encounter (Signed)
Called pt and left a VM that RX was called into pt's pharmacy.

## 2018-07-10 NOTE — Telephone Encounter (Signed)
Rx has been sent into pt's pharmacy.  

## 2018-07-22 ENCOUNTER — Ambulatory Visit: Payer: Medicare Other | Admitting: Family Medicine

## 2018-07-22 ENCOUNTER — Encounter: Payer: Self-pay | Admitting: Family Medicine

## 2018-07-22 VITALS — BP 114/64 | HR 86 | Temp 98.9°F | Wt 238.5 lb

## 2018-07-22 DIAGNOSIS — G629 Polyneuropathy, unspecified: Secondary | ICD-10-CM

## 2018-07-22 DIAGNOSIS — M159 Polyosteoarthritis, unspecified: Secondary | ICD-10-CM

## 2018-07-22 DIAGNOSIS — I10 Essential (primary) hypertension: Secondary | ICD-10-CM

## 2018-07-22 DIAGNOSIS — E1159 Type 2 diabetes mellitus with other circulatory complications: Secondary | ICD-10-CM | POA: Diagnosis not present

## 2018-07-22 DIAGNOSIS — M150 Primary generalized (osteo)arthritis: Secondary | ICD-10-CM | POA: Diagnosis not present

## 2018-07-22 MED ORDER — AMITRIPTYLINE HCL 50 MG PO TABS: 50.0000 mg | ORAL_TABLET | Freq: Every day | ORAL | 5 refills | Status: DC

## 2018-07-22 NOTE — Progress Notes (Signed)
   Subjective:    Patient ID: [name], male    DOB: 19-Jun-1946, 72 y.o.   MRN: [id number]  HPI Here to follow up in neuropathy. This causes severe pains in the legs and feet and abdomen, along with numbness. He has increase the Gabapentin as we discussed but with little response. His diabetes continues to improve with the new medications.    Review of Systems  Constitutional: Negative.   Respiratory: Negative.   Cardiovascular: Negative.   Gastrointestinal: Positive for abdominal pain. Negative for abdominal distention, constipation, diarrhea and nausea.  Neurological: Positive for numbness. Negative for weakness.       Objective:   Physical Exam  Constitutional: He is oriented to person, place, and time. He appears well-developed and well-nourished.  Cardiovascular: Normal rate, regular rhythm, normal heart sounds and intact distal pulses.  Pulmonary/Chest: Effort normal and breath sounds normal. No stridor. No respiratory distress. He has no wheezes. He has no rales.  Musculoskeletal: He exhibits no edema.  Neurological: He is alert and oriented to person, place, and time.          Assessment & Plan:  Neuropathy, we will add Amitriptyline 50 mg qhs. Refer to Neurology for further evaluation. Alysia Penna, MD

## 2018-07-24 ENCOUNTER — Encounter: Payer: Self-pay | Admitting: Neurology

## 2018-08-20 ENCOUNTER — Ambulatory Visit: Payer: Self-pay | Admitting: Family Medicine

## 2018-09-10 ENCOUNTER — Encounter: Payer: Self-pay | Admitting: Family Medicine

## 2018-09-11 ENCOUNTER — Encounter: Payer: Self-pay | Admitting: Family Medicine

## 2018-09-11 ENCOUNTER — Ambulatory Visit (INDEPENDENT_AMBULATORY_CARE_PROVIDER_SITE_OTHER): Payer: Medicare Other | Admitting: Family Medicine

## 2018-09-11 VITALS — BP 120/76 | HR 100 | Temp 97.9°F | Ht 68.0 in | Wt 240.8 lb

## 2018-09-11 DIAGNOSIS — G629 Polyneuropathy, unspecified: Secondary | ICD-10-CM

## 2018-09-11 DIAGNOSIS — I10 Essential (primary) hypertension: Secondary | ICD-10-CM

## 2018-09-11 DIAGNOSIS — K219 Gastro-esophageal reflux disease without esophagitis: Secondary | ICD-10-CM | POA: Diagnosis not present

## 2018-09-11 DIAGNOSIS — M150 Primary generalized (osteo)arthritis: Secondary | ICD-10-CM

## 2018-09-11 DIAGNOSIS — M069 Rheumatoid arthritis, unspecified: Secondary | ICD-10-CM

## 2018-09-11 DIAGNOSIS — N138 Other obstructive and reflux uropathy: Secondary | ICD-10-CM

## 2018-09-11 DIAGNOSIS — E782 Mixed hyperlipidemia: Secondary | ICD-10-CM

## 2018-09-11 DIAGNOSIS — M159 Polyosteoarthritis, unspecified: Secondary | ICD-10-CM

## 2018-09-11 DIAGNOSIS — E1140 Type 2 diabetes mellitus with diabetic neuropathy, unspecified: Secondary | ICD-10-CM | POA: Diagnosis not present

## 2018-09-11 DIAGNOSIS — N401 Enlarged prostate with lower urinary tract symptoms: Secondary | ICD-10-CM

## 2018-09-11 DIAGNOSIS — I2510 Atherosclerotic heart disease of native coronary artery without angina pectoris: Secondary | ICD-10-CM

## 2018-09-11 LAB — CBC WITH DIFFERENTIAL/PLATELET
BASOS PCT: 0.7 % (ref 0.0–3.0)
Basophils Absolute: 0.1 10*3/uL (ref 0.0–0.1)
EOS PCT: 6 % — AB (ref 0.0–5.0)
Eosinophils Absolute: 0.6 10*3/uL (ref 0.0–0.7)
HEMATOCRIT: 48.2 % (ref 39.0–52.0)
HEMOGLOBIN: 16.1 g/dL (ref 13.0–17.0)
LYMPHS PCT: 34.1 % (ref 12.0–46.0)
Lymphs Abs: 3.5 10*3/uL (ref 0.7–4.0)
MCHC: 33.3 g/dL (ref 30.0–36.0)
MCV: 90.9 fl (ref 78.0–100.0)
Monocytes Absolute: 1.2 10*3/uL — ABNORMAL HIGH (ref 0.1–1.0)
Monocytes Relative: 11.9 % (ref 3.0–12.0)
Neutro Abs: 4.8 10*3/uL (ref 1.4–7.7)
Neutrophils Relative %: 47.3 % (ref 43.0–77.0)
PLATELETS: 342 10*3/uL (ref 150.0–400.0)
RBC: 5.3 Mil/uL (ref 4.22–5.81)
RDW: 15 % (ref 11.5–15.5)
WBC: 10.1 10*3/uL (ref 4.0–10.5)

## 2018-09-11 LAB — POC URINALSYSI DIPSTICK (AUTOMATED)
Bilirubin, UA: NEGATIVE
GLUCOSE UA: POSITIVE — AB
Ketones, UA: NEGATIVE
LEUKOCYTES UA: NEGATIVE
Nitrite, UA: NEGATIVE
Protein, UA: POSITIVE — AB
RBC UA: NEGATIVE
Spec Grav, UA: 1.025 (ref 1.010–1.025)
UROBILINOGEN UA: 0.2 U/dL
pH, UA: 5 (ref 5.0–8.0)

## 2018-09-11 LAB — HEPATIC FUNCTION PANEL
ALK PHOS: 72 U/L (ref 39–117)
ALT: 22 U/L (ref 0–53)
AST: 17 U/L (ref 0–37)
Albumin: 4.2 g/dL (ref 3.5–5.2)
BILIRUBIN DIRECT: 0.1 mg/dL (ref 0.0–0.3)
BILIRUBIN TOTAL: 0.3 mg/dL (ref 0.2–1.2)
Total Protein: 6.8 g/dL (ref 6.0–8.3)

## 2018-09-11 LAB — BASIC METABOLIC PANEL
BUN: 14 mg/dL (ref 6–23)
CALCIUM: 10.8 mg/dL — AB (ref 8.4–10.5)
CO2: 28 mEq/L (ref 19–32)
Chloride: 103 mEq/L (ref 96–112)
Creatinine, Ser: 0.67 mg/dL (ref 0.40–1.50)
GFR: 123.54 mL/min (ref >60.00)
Glucose, Bld: 178 mg/dL — ABNORMAL HIGH (ref 70–99)
Potassium: 5 mEq/L (ref 3.5–5.1)
SODIUM: 137 meq/L (ref 135–145)

## 2018-09-11 LAB — LIPID PANEL
Cholesterol: 220 mg/dL — ABNORMAL HIGH (ref 0–200)
HDL: 43.1 mg/dL (ref >39.00)
NONHDL: 176.55
Total CHOL/HDL Ratio: 5
Triglycerides: 309 mg/dL — ABNORMAL HIGH (ref 0.0–149.0)
VLDL: 61.8 mg/dL — AB (ref 0.0–40.0)

## 2018-09-11 LAB — TSH: TSH: 0.98 u[IU]/mL (ref 0.35–4.50)

## 2018-09-11 LAB — HEMOGLOBIN A1C: Hgb A1c MFr Bld: 7.9 % — ABNORMAL HIGH (ref 4.6–6.5)

## 2018-09-11 LAB — LDL CHOLESTEROL, DIRECT: LDL DIRECT: 152 mg/dL

## 2018-09-11 LAB — PSA: PSA: 3.13 ng/mL (ref 0.10–4.00)

## 2018-09-11 NOTE — Progress Notes (Signed)
   Subjective:    Patient ID: [name], male    DOB: 11-23-1945, 72 y.o.   MRN: [id number]  HPI Here to follow up on issues. In general he is doing well. His am fasting glucoses have improved quite a bit the past few months, now ranging from 130 to 170. After we increased the dose of his Gabapentin and added Amitriptyline, his neuropathic pain in the abdomen and legs has improved dramatically. He now says he "feels 80% better". His BP is stable. He will be seeing Dr. Metta Clines in Neurology next week and he will see Dr. Ky Barban in Urology in several weeks.    Review of Systems  Constitutional: Negative.   HENT: Negative.   Eyes: Negative.   Respiratory: Negative.   Cardiovascular: Negative.   Gastrointestinal: Positive for abdominal pain.  Genitourinary: Negative.   Musculoskeletal: Negative.   Skin: Negative.   Neurological: Positive for weakness and numbness.  Psychiatric/Behavioral: Negative.        Objective:   Physical Exam  Constitutional: He is oriented to person, place, and time. He appears well-developed and well-nourished. No distress.  HENT:  Head: Normocephalic and atraumatic.  Right Ear: External ear normal.  Left Ear: External ear normal.  Nose: Nose normal.  Mouth/Throat: Oropharynx is clear and moist. No oropharyngeal exudate.  Eyes: Pupils are equal, round, and reactive to light. Conjunctivae and EOM are normal. Right eye exhibits no discharge. Left eye exhibits no discharge. No scleral icterus.  Neck: Neck supple. No JVD present. No tracheal deviation present. No thyromegaly present.  Cardiovascular: Normal rate, regular rhythm, normal heart sounds and intact distal pulses. Exam reveals no gallop and no friction rub.  No murmur heard. Pulmonary/Chest: Effort normal and breath sounds normal. No respiratory distress. He has no wheezes. He has no rales. He exhibits no tenderness.  Abdominal: Soft. Bowel sounds are normal. He exhibits no distension and no mass.  There is no tenderness. There is no rebound and no guarding.  Musculoskeletal: Normal range of motion. He exhibits no edema or tenderness.  Lymphadenopathy:    He has no cervical adenopathy.  Neurological: He is alert and oriented to person, place, and time. He has normal reflexes. He displays normal reflexes. No cranial nerve deficit. He exhibits normal muscle tone. Coordination normal.  Skin: Skin is warm and dry. No rash noted. He is not diaphoretic. No erythema. No pallor.  Psychiatric: He has a normal mood and affect. His behavior is normal. Judgment and thought content normal.          Assessment & Plan:  His HTN and CAD are stable. His diabetes has improved on Steglujan, and we will check an A1c today. His neuropathy has improved as well. He will get fasting labs to check lipids, renal function, etc. His depression is well controlled. Alysia Penna, MD

## 2018-09-17 ENCOUNTER — Encounter: Payer: Self-pay | Admitting: *Deleted

## 2018-09-17 NOTE — Progress Notes (Signed)
NEUROLOGY CONSULTATION NOTE  [name] MRN: [id number] DOB: 03-14-1946  Referring provider: Alysia Penna, MD Primary care provider: Alysia Penna, MD  Reason for consult: Neuropathy  HISTORY OF PRESENT ILLNESS: [name] is a 72 year old male with type 2 diabetes mellitus, IBS, coronary artery disease, hypertension, hyperlipidemia, who presents for neuropathy.  History supplemented by referring providers notes.  He is accompanied by his wife who also supplements history.  He has longstanding history of uncontrolled diabetes mellitus not on medication.  Up until this past year, Hgb A1c had been over 14.  He developed nerve pain 8 months ago.  He reports allodynia over the abdomen and radiating to the back.  Driving over a bump will trigger pain.  He also reports burning and numbness in the legs from the feet up to the knees.  There is sometimes sharp pains in the legs as well.  Over this past year, Hgb A1c has trended down.  Most recent lab from 09/11/18 was 7.9.   For neuropathic pain, he is taking gabapentin 900mg  in AM, 600mg  at noon and 1800mg  at bedtime; amitriptyline 50mg  at bedtime was started a couple of months ago.  He is also use capsaicin spray.  On these medications, he feels pain is 85% improved.  Past medications include:  none  PAST MEDICAL HISTORY: Past Medical History:  Diagnosis Date  . Allergy   . BPH (benign prostatic hyperplasia)    sees Dr. Peterson Lombard in [facility]   . CAD (coronary artery disease)   . Depression   . Diabetes mellitus   . Elevated PSA    sees Dr. Peterson Lombard in [facility]  . GERD (gastroesophageal reflux disease)   . Hip pain, bilateral    sees Dr. Alvan Dame   . Hyperlipidemia   . Hypertension   . Hypogonadism male   . Insomnia     PAST SURGICAL HISTORY: Past Surgical History:  Procedure Laterality Date  . CHOLECYSTECTOMY    . COLONOSCOPY  12/16/2017   per Dr. Lucienne Capers at [place], single benign polyp, sigmoid diverticulosis,  hemorrhoids   . ESOPHAGOGASTRODUODENOSCOPY  12/16/2017   per Dr. Shary Key at [place], gastritis and duodenitis   . HERNIA REPAIR     umbilical  . TRANSURETHRAL RESECTION OF PROSTATE  January 2010   per Dr. Ky Barban    MEDICATIONS: Current Outpatient Medications on File Prior to Visit  Medication Sig Dispense Refill  . amitriptyline (ELAVIL) 50 MG tablet Take 1 tablet (50 mg total) by mouth at bedtime. 30 tablet 5  . Cinnamon 500 MG TABS Take 2 tablets (1,000 mg total) by mouth 3 (three) times daily. 3 tablet 0  . dicyclomine (BENTYL) 20 MG tablet Take 1 tablet (20 mg total) by mouth 3 (three) times daily before meals. 3 tablet 0  . Ertugliflozin-SITagliptin (STEGLUJAN) 5-100 MG TABS Take 1 tablet by mouth daily. 30 tablet 5  . gabapentin (NEURONTIN) 300 MG capsule Take 3 capsules in the morning, 2 capsules at noon, and 6 capsules at bedtime 330 capsule 5  . linaclotide (LINZESS) 145 MCG CAPS capsule Take 1 capsule (145 mcg total) by mouth daily before breakfast. 30 capsule 0  . meloxicam (MOBIC) 15 MG tablet Take 1 tablet (15 mg total) by mouth daily. 30 tablet 5  . omeprazole (PRILOSEC) 20 MG capsule TAKE 1 CAPSULE DAILY 90 capsule 3  . polyethylene glycol powder (GLYCOLAX/MIRALAX) powder Take 17 g by mouth 2 (two) times daily as needed. 3350 g 1  .  tadalafil (CIALIS) 5 MG tablet Take 1 tablet (5 mg total) by mouth daily. 30 tablet 11  . valsartan-hydrochlorothiazide (DIOVAN HCT) 80-12.5 MG tablet Take 1 tablet by mouth daily. 90 tablet 3  . [DISCONTINUED] lisinopril-hydrochlorothiazide (PRINZIDE,ZESTORETIC) 20-25 MG per tablet Take 1 tablet by mouth daily. 30 tablet 11   No current facility-administered medications on file prior to visit.     ALLERGIES: Allergies  Allergen Reactions  . Codeine Phosphate     REACTION: vomiting    FAMILY HISTORY: Family History  Problem Relation Age of Onset  . Depression Unknown   . Diabetes Neg Hx    SOCIAL HISTORY: Social History    Socioeconomic History  . Marital status: Married    Spouse name: Not on file  . Number of children: Not on file  . Years of education: Not on file  . Highest education level: Not on file  Occupational History  . Not on file  Social Needs  . Financial resource strain: Not on file  . Food insecurity:    Worry: Not on file    Inability: Not on file  . Transportation needs:    Medical: Not on file    Non-medical: Not on file  Tobacco Use  . Smoking status: Former Smoker    Packs/day: 0.50    Years: 15.00    Pack years: 7.50    Types: Cigarettes    Last attempt to quit: 2010    Years since quitting: 9.8  . Smokeless tobacco: Never Used  Substance and Sexual Activity  . Alcohol use: No    Alcohol/week: 0.0 standard drinks  . Drug use: No  . Sexual activity: Not on file  Lifestyle  . Physical activity:    Days per week: Not on file    Minutes per session: Not on file  . Stress: Not on file  Relationships  . Social connections:    Talks on phone: Not on file    Gets together: Not on file    Attends religious service: Not on file    Active member of club or organization: Not on file    Attends meetings of clubs or organizations: Not on file    Relationship status: Not on file  . Intimate partner violence:    Fear of current or ex partner: Not on file    Emotionally abused: Not on file    Physically abused: Not on file    Forced sexual activity: Not on file  Other Topics Concern  . Not on file  Social History Narrative  . Not on file    REVIEW OF SYSTEMS: Constitutional: No fevers, chills, or sweats, no generalized fatigue, change in appetite Eyes: No visual changes, double vision, eye pain Ear, nose and throat: No hearing loss, ear pain, nasal congestion, sore throat Cardiovascular: No chest pain, palpitations Respiratory:  No shortness of breath at rest or with exertion, wheezes GastrointestinaI: No nausea, vomiting, diarrhea, abdominal pain, fecal  incontinence Genitourinary:  No dysuria, urinary retention or frequency Musculoskeletal:  No neck pain, back pain Integumentary: No rash, pruritus, skin lesions Neurological: as above Psychiatric: No depression, insomnia, anxiety Endocrine: No palpitations, fatigue, diaphoresis, mood swings, change in appetite, change in weight, increased thirst Hematologic/Lymphatic:  No purpura, petechiae. Allergic/Immunologic: no itchy/runny eyes, nasal congestion, recent allergic reactions, rashes  PHYSICAL EXAM: Blood pressure 132/90, pulse 85, height 5\' 8"  (1.727 m), weight 243 lb (110.2 kg), SpO2 97 %. General: No acute distress.  Patient appears well-groomed.  Head:  Normocephalic/atraumatic Eyes:  fundi examined but not visualized Neck: supple, no paraspinal tenderness, full range of motion Back: No paraspinal tenderness Heart: regular rate and rhythm Lungs: Clear to auscultation bilaterally. Vascular: No carotid bruits. Neurological Exam: Mental status: alert and oriented to person, place, and time, recent and remote memory intact, fund of knowledge intact, attention and concentration intact, speech fluent and not dysarthric, language intact. Cranial nerves: CN I: not tested CN II: pupils equal, round and reactive to light, visual fields intact CN III, IV, VI:  full range of motion, no nystagmus, no ptosis CN V: facial sensation intact CN VII: upper and lower face symmetric CN VIII: hearing intact CN IX, X: gag intact, uvula midline CN XI: sternocleidomastoid and trapezius muscles intact CN XII: tongue midline Bulk & Tone: normal, no fasciculations. Motor:  5/5 throughout  Sensation:  Pinprick sensation intact in lower extremities but reduced over the abdomen; and vibration sensation reduced up to the knees. Deep Tendon Reflexes: 1+ in upper extremities, absent in lower extremities, toes downgoing.  Finger to nose testing:  Without dysmetria.  Heel to shin:  Without dysmetria.  Gait:   Normal station and stride.  Romberg negative.  IMPRESSION: 1.  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  I will increase amitriptyline to 100 mg at bedtime. 2.  He will continue gabapentin 900 mg in a.m., 600 mg at noon and 1800 mg at bedtime. 3.  He will continue to optimize glycemic control. 4.  Follow-up in 3 to 4 months.  Thank you for allowing me to take part in the care of this patient.  Metta Clines, DO  CC: Alysia Penna, MD

## 2018-09-19 ENCOUNTER — Encounter

## 2018-09-19 ENCOUNTER — Ambulatory Visit (INDEPENDENT_AMBULATORY_CARE_PROVIDER_SITE_OTHER): Payer: Medicare Other | Admitting: Neurology

## 2018-09-19 ENCOUNTER — Encounter: Payer: Self-pay | Admitting: Neurology

## 2018-09-19 VITALS — BP 132/90 | HR 85 | Ht 68.0 in | Wt 243.0 lb

## 2018-09-19 DIAGNOSIS — E1142 Type 2 diabetes mellitus with diabetic polyneuropathy: Secondary | ICD-10-CM

## 2018-09-19 DIAGNOSIS — I2510 Atherosclerotic heart disease of native coronary artery without angina pectoris: Secondary | ICD-10-CM | POA: Diagnosis not present

## 2018-09-19 MED ORDER — AMITRIPTYLINE HCL 100 MG PO TABS: 100.0000 mg | ORAL_TABLET | Freq: Every day | ORAL | 3 refills | Status: DC

## 2018-09-19 NOTE — Patient Instructions (Signed)
1.  I will increase amitriptyline to 100mg  at bedtime.  I sent in a new prescription. 2.  Continue gabapentin 900mg  in morning, 600mg  at noon and 1800mg  at bedtime 3.  Follow up in 3 to 4 months.

## 2018-11-25 ENCOUNTER — Other Ambulatory Visit: Payer: Self-pay | Admitting: Family Medicine

## 2019-01-16 ENCOUNTER — Other Ambulatory Visit: Payer: Self-pay | Admitting: Neurology

## 2019-01-30 ENCOUNTER — Ambulatory Visit: Payer: Self-pay | Admitting: Neurology

## 2019-02-23 ENCOUNTER — Other Ambulatory Visit: Payer: Self-pay | Admitting: Family Medicine

## 2019-03-26 ENCOUNTER — Other Ambulatory Visit: Payer: Self-pay | Admitting: Family Medicine

## 2019-04-02 ENCOUNTER — Ambulatory Visit: Payer: Self-pay

## 2019-04-02 ENCOUNTER — Telehealth: Payer: Self-pay | Admitting: Family Medicine

## 2019-04-02 MED ORDER — OMEPRAZOLE 20 MG PO CPDR: 20.0000 mg | DELAYED_RELEASE_CAPSULE | Freq: Every day | ORAL | 0 refills | Status: DC

## 2019-04-02 MED ORDER — NAPROXEN 500 MG PO TABS: ORAL_TABLET | ORAL | 2 refills | Status: DC

## 2019-04-02 NOTE — Telephone Encounter (Signed)
Copied from [place] [id number]. Topic: Quick Communication - Rx Refill/Question >> Apr 02, 2019 11:23 AM Rainey Pines A wrote: Medication: naproxen (NAPROSYN) 500 MG tablet [Pharmacy Med Name: NAPROXEN 500 MG TABLET],omeprazole (PRILOSEC) 20 MG capsule   Has the patient contacted their pharmacy? Yes (Agent: If no, request that the patient contact the pharmacy for the refill.) (Agent: If yes, when and what did the pharmacy advise?)Contact PCP  Preferred Pharmacy (with phone number or street name): [id number] - [place], [place] - [address] [phone] (Phone) [phone] (Fax)    Agent: Please be advised that RX refills may take up to 3 business days. We ask that you follow-up with your pharmacy.

## 2019-04-02 NOTE — Telephone Encounter (Signed)
Pt. was contacted.  Stated he has not had a cough since January; rather, he had a cough in January, that resolved.  For past 3-4 weeks reported that foods don't taste like they normally would.  Described that food tastes "pasty."   Reported that it is affecting his desire to eat.  Stated has intermittent cough; "about every 3-4 days".  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  Stated has some shortness of breath in going up an incline; reported this is a chronic problem for past several years.  Pt. denied headaches, fever, sore throat, body aches, or runny nose.  Denied known exposure to COVID 19; denied travel in past 2 weeks.  Based on being considered high risk, and reported change in taste, recommended an appt. With PCP.  Pt. Agreed with plan.   Transferred to Flow Coordinator to have appt. scheduled.      Message from Beverley Fiedler sent at 04/02/2019 11:32 AM EDT   Summary: Clinical Advice   Patient stated that he has had a cough since January and lost his taste a month ago and is unsure what he needs to do next. Please advise.          Reason for Disposition . HIGH RISK patient (e.g., age > 77 years, diabetes, heart or lung disease, weak immune system)    C/o intermittent wheezing when working in [employer], since February.  C/o chronic shortness of breath with going up an incline; approx. 8-9 yrs.  Answer Assessment - Initial Assessment Questions 1. COVID-19 DIAGNOSIS: "Who made your Coronavirus (COVID-19) diagnosis?" "Was it confirmed by a positive lab test?" If not diagnosed by a HCP, ask "Are there lots of cases (community spread) where you live?" (See public health department website, if unsure)   * MAJOR community spread: high number of cases; numbers of cases are increasing; many people hospitalized.   * MINOR community spread: low number of cases; not increasing; few or no people hospitalized     Present in the community  (denies any  known exposure to COVID 19; denies any recent travel outside state.) 2. ONSET: "When did the COVID-19 symptoms start?"      Taste changed about 3-4 weeks ago.  3. WORST SYMPTOM: "What is your worst symptom?" (e.g., cough, fever, shortness of breath, muscle aches)     Change in taste; foods taste "pasty".  4. COUGH: "Do you have a cough?" If so, ask: "How bad is the cough?"       Reported has an intermittent cough about every 3-4 days 5. FEVER: "Do you have a fever?" If so, ask: "What is your temperature, how was it measured, and when did it start?"     Denied fever/ chills  6. RESPIRATORY STATUS: "Describe your breathing?" (e.g., shortness of breath, wheezing, unable to speak)      Some wheezing when working in [employer], or shortly after being in [employer].  Some shortness of breath with going up incline of driveway.  7. BETTER-SAME-WORSE: "Are you getting better, staying the same or getting worse compared to yesterday?"  If getting worse, ask, "In what way?"     Cough has improved from January.  The taste change concerns him; not getting better.  8. HIGH RISK DISEASE: "Do you have any chronic medical problems?" (e.g., asthma, heart or lung disease, weak immune system, etc.)     DM, CAD, Obesity, HTN 9. PREGNANCY: "Is there any chance you  are pregnant?" "When was your last menstrual period?"     N/a  10. OTHER SYMPTOMS: "Do you have any other symptoms?"  (e.g., runny nose, headache, sore throat, loss of smell)       Denied HA, sore throat, body aches, or runny nose,        Reported left shoulder sore from lifting bag of potting soil  Protocols used: CORONAVIRUS (COVID-19) DIAGNOSED OR SUSPECTED-A-AH

## 2019-04-03 ENCOUNTER — Encounter: Payer: Self-pay | Admitting: Family Medicine

## 2019-04-03 ENCOUNTER — Ambulatory Visit (INDEPENDENT_AMBULATORY_CARE_PROVIDER_SITE_OTHER): Payer: Medicare Other | Admitting: Family Medicine

## 2019-04-03 ENCOUNTER — Other Ambulatory Visit: Payer: Self-pay

## 2019-04-03 DIAGNOSIS — R0602 Shortness of breath: Secondary | ICD-10-CM | POA: Diagnosis not present

## 2019-04-03 DIAGNOSIS — R432 Parageusia: Secondary | ICD-10-CM | POA: Diagnosis not present

## 2019-04-03 MED ORDER — OMEPRAZOLE 20 MG PO CPDR
20.0000 mg | DELAYED_RELEASE_CAPSULE | Freq: Every day | ORAL | 11 refills | Status: AC
Start: 2019-04-03 — End: ?

## 2019-04-03 MED ORDER — NAPROXEN 500 MG PO TABS: ORAL_TABLET | ORAL | 11 refills | Status: DC

## 2019-04-03 NOTE — Progress Notes (Signed)
Subjective:    Patient ID: [name], male    DOB: 17-Feb-1946, 73 y.o.   MRN: [id number]  HPI Virtual Visit via Video Note  I connected with the patient on 04/03/19 at 11:15 AM EDT by a video enabled telemedicine application and verified that I am speaking with the correct person using two identifiers.  Location patient: home Location provider:work or home office Persons participating in the virtual visit: patient, provider  I discussed the limitations of evaluation and management by telemedicine and the availability of in person appointments. The patient expressed understanding and agreed to proceed.   HPI: Here asking advice about some slight SOB with exertion and several months of altered taste. He has had some SOB with exertion for years, and he gives an example of this happening when he pulls his trash can 150 feet up his driveway to the curb (this is a steep incline). No chest pain with this. Walking on level ground is never a problem. As for the taste, he says some foods taste "bitter" to him. He denies any loss of taste or smell. No change in medications. He takes no supplements. He is askng if he should be concerned about a possible Covid-19 infection. He denies any fever, HA, ST, cough, body aches, or NVD.    ROS: See pertinent positives and negatives per HPI.  Past Medical History:  Diagnosis Date  . Allergy   . BPH (benign prostatic hyperplasia)    sees Dr. Peterson Lombard in [facility]   . CAD (coronary artery disease)   . Depression   . Diabetes mellitus   . Elevated PSA    sees Dr. Peterson Lombard in [facility]  . GERD (gastroesophageal reflux disease)   . Hip pain, bilateral    sees Dr. Alvan Dame   . Hyperlipidemia   . Hypertension   . Hypogonadism male   . Insomnia     Past Surgical History:  Procedure Laterality Date  . CHOLECYSTECTOMY    . COLONOSCOPY  12/16/2017   per Dr. Lucienne Capers at [place], single benign polyp, sigmoid diverticulosis, hemorrhoids   .  ESOPHAGOGASTRODUODENOSCOPY  12/16/2017   per Dr. Shary Key at [place], gastritis and duodenitis   . HERNIA REPAIR     umbilical  . TRANSURETHRAL RESECTION OF PROSTATE  January 2010   per Dr. Ky Barban    Family History  Problem Relation Age of Onset  . Depression Unknown   . Bowel Disease Mother 83  . Heart attack Father 19  . Osteoporosis Sister   . Coronary artery disease Brother   . Diabetes Neg Hx      Current Outpatient Medications:  .  amitriptyline (ELAVIL) 100 MG tablet, TAKE 1 TABLET BY MOUTH EVERYDAY AT BEDTIME, Disp: 90 tablet, Rfl: 0 .  Cinnamon 500 MG TABS, Take 2 tablets (1,000 mg total) by mouth 3 (three) times daily., Disp: 3 tablet, Rfl: 0 .  dicyclomine (BENTYL) 20 MG tablet, Take 1 tablet (20 mg total) by mouth 3 (three) times daily before meals., Disp: 3 tablet, Rfl: 0 .  gabapentin (NEURONTIN) 300 MG capsule, TAKE 3 CAPSULES IN THE MORNING, 2 CAPSULES AT NOON, AND 6 CAPSULES AT BEDTIME, Disp: 330 capsule, Rfl: 5 .  linaclotide (LINZESS) 145 MCG CAPS capsule, Take 1 capsule (145 mcg total) by mouth daily before breakfast., Disp: 30 capsule, Rfl: 0 .  meloxicam (MOBIC) 15 MG tablet, Take 1 tablet (15 mg total) by mouth daily., Disp: 30 tablet, Rfl: 5 .  naproxen (NAPROSYN)  500 MG tablet, TAKE 1 TABLET (500 MG TOTAL) BY MOUTH 2 (TWO) TIMES DAILY WITH A MEAL., Disp: 60 tablet, Rfl: 11 .  omeprazole (PRILOSEC) 20 MG capsule, Take 1 capsule (20 mg total) by mouth daily., Disp: 30 capsule, Rfl: 11 .  polyethylene glycol powder (GLYCOLAX/MIRALAX) powder, Take 17 g by mouth 2 (two) times daily as needed., Disp: 3350 g, Rfl: 1 .  STEGLUJAN 5-100 MG TABS, TAKE 1 TABLET BY MOUTH EVERY DAY, Disp: 30 tablet, Rfl: 5 .  tadalafil (CIALIS) 5 MG tablet, Take 1 tablet (5 mg total) by mouth daily., Disp: 30 tablet, Rfl: 11 .  valsartan-hydrochlorothiazide (DIOVAN HCT) 80-12.5 MG tablet, Take 1 tablet by mouth daily., Disp: 90 tablet, Rfl: 3  EXAM:  VITALS per patient if applicable:   GENERAL: alert, oriented, appears well and in no acute distress  HEENT: atraumatic, conjunttiva clear, no obvious abnormalities on inspection of external nose and ears  NECK: normal movements of the head and neck  LUNGS: on inspection no signs of respiratory distress, breathing rate appears normal, no obvious gross SOB, gasping or wheezing  CV: no obvious cyanosis  MS: moves all visible extremities without noticeable abnormality  PSYCH/NEURO: pleasant and cooperative, no obvious depression or anxiety, speech and thought processing grossly intact  ASSESSMENT AND PLAN: I told him that none of these symptoms are concerning to me as far as the virus is concerned. He is obese and out of shape, and I suggested he start walking every day like he used to do. I cannot explain the taste change he describes. He will let us know if anything changes.  Alysia Penna, MD  Discussed the following assessment and plan:  No diagnosis found.     I discussed the assessment and treatment plan with the patient. The patient was provided an opportunity to ask questions and all were answered. The patient agreed with the plan and demonstrated an understanding of the instructions.   The patient was advised to call back or seek an in-person evaluation if the symptoms worsen or if the condition fails to improve as anticipated.     Review of Systems     Objective:   Physical Exam        Assessment & Plan:

## 2019-04-22 ENCOUNTER — Other Ambulatory Visit: Payer: Self-pay | Admitting: Neurology

## 2019-05-26 ENCOUNTER — Other Ambulatory Visit: Payer: Self-pay | Admitting: Family Medicine

## 2019-06-02 ENCOUNTER — Other Ambulatory Visit: Payer: Self-pay | Admitting: Family Medicine

## 2019-07-14 ENCOUNTER — Other Ambulatory Visit: Payer: Self-pay | Admitting: Family Medicine

## 2019-07-22 ENCOUNTER — Other Ambulatory Visit: Payer: Self-pay | Admitting: Neurology

## 2019-07-25 ENCOUNTER — Other Ambulatory Visit: Payer: Self-pay | Admitting: Neurology

## 2019-07-27 ENCOUNTER — Telehealth (INDEPENDENT_AMBULATORY_CARE_PROVIDER_SITE_OTHER): Payer: Medicare Other | Admitting: Family Medicine

## 2019-07-27 ENCOUNTER — Telehealth: Payer: Self-pay | Admitting: Neurology

## 2019-07-27 ENCOUNTER — Encounter: Payer: Self-pay | Admitting: Family Medicine

## 2019-07-27 ENCOUNTER — Other Ambulatory Visit: Payer: Self-pay

## 2019-07-27 DIAGNOSIS — M25512 Pain in left shoulder: Secondary | ICD-10-CM

## 2019-07-27 DIAGNOSIS — G8929 Other chronic pain: Secondary | ICD-10-CM | POA: Diagnosis not present

## 2019-07-27 MED ORDER — MELOXICAM 15 MG PO TABS: 15.0000 mg | ORAL_TABLET | Freq: Every day | ORAL | 11 refills | Status: DC

## 2019-07-27 NOTE — Telephone Encounter (Signed)
Patient left vm about amitriptyline medication refill to the [facility] in [place]. Thanks!

## 2019-07-27 NOTE — Progress Notes (Signed)
Virtual Visit via Video Note  I connected with the patient on 07/27/19 at  1:00 PM EDT by a video enabled telemedicine application and verified that I am speaking with the correct person using two identifiers.  Location patient: home Location provider:work or home office Persons participating in the virtual visit: patient, provider  I discussed the limitations of evaluation and management by telemedicine and the availability of in person appointments. The patient expressed understanding and agreed to proceed.   HPI: Here asking for help for left shoulder pain. He injured it in January by picking up something heavy, and it has bothered him ever since. Most of his pain comes from lifting the arm above his head. No radiation of pain down the arm. Naproxen does not help.   ROS: See pertinent positives and negatives per HPI.  Past Medical History:  Diagnosis Date  . Allergy   . BPH (benign prostatic hyperplasia)    sees Dr. Peterson Lombard in [facility]   . CAD (coronary artery disease)   . Depression   . Diabetes mellitus   . Elevated PSA    sees Dr. Peterson Lombard in [facility]  . GERD (gastroesophageal reflux disease)   . Hip pain, bilateral    sees Dr. Alvan Dame   . Hyperlipidemia   . Hypertension   . Hypogonadism male   . Insomnia     Past Surgical History:  Procedure Laterality Date  . CHOLECYSTECTOMY    . COLONOSCOPY  12/16/2017   per Dr. Lucienne Capers at [place], single benign polyp, sigmoid diverticulosis, hemorrhoids   . ESOPHAGOGASTRODUODENOSCOPY  12/16/2017   per Dr. Shary Key at [place], gastritis and duodenitis   . HERNIA REPAIR     umbilical  . TRANSURETHRAL RESECTION OF PROSTATE  January 2010   per Dr. Ky Barban    Family History  Problem Relation Age of Onset  . Depression Unknown   . Bowel Disease Mother 53  . Heart attack Father 43  . Osteoporosis Sister   . Coronary artery disease Brother   . Diabetes Neg Hx      Current Outpatient Medications:  .   amitriptyline (ELAVIL) 100 MG tablet, TAKE 1 TABLET BY MOUTH EVERYDAY AT BEDTIME, Disp: 90 tablet, Rfl: 0 .  dicyclomine (BENTYL) 20 MG tablet, Take 1 tablet (20 mg total) by mouth 3 (three) times daily before meals., Disp: 3 tablet, Rfl: 0 .  gabapentin (NEURONTIN) 300 MG capsule, TAKE 3 CAPSULES IN THE MORNING, 2 CAPSULES AT NOON, AND 6 CAPSULES AT BEDTIME, Disp: 330 capsule, Rfl: 5 .  meloxicam (MOBIC) 15 MG tablet, Take 1 tablet (15 mg total) by mouth daily., Disp: 30 tablet, Rfl: 11 .  naproxen (NAPROSYN) 500 MG tablet, TAKE 1 TABLET (500 MG TOTAL) BY MOUTH 2 (TWO) TIMES DAILY WITH A MEAL., Disp: 60 tablet, Rfl: 11 .  omeprazole (PRILOSEC) 20 MG capsule, Take 1 capsule (20 mg total) by mouth daily., Disp: 30 capsule, Rfl: 11 .  STEGLUJAN 5-100 MG TABS, TAKE 1 TABLET BY MOUTH EVERY DAY, Disp: 30 tablet, Rfl: 2 .  tadalafil (CIALIS) 5 MG tablet, Take 1 tablet (5 mg total) by mouth daily., Disp: 30 tablet, Rfl: 11 .  valsartan-hydrochlorothiazide (DIOVAN-HCT) 80-12.5 MG tablet, TAKE 1 TABLET BY MOUTH EVERY DAY, Disp: 90 tablet, Rfl: 0 .  Cinnamon 500 MG TABS, Take 2 tablets (1,000 mg total) by mouth 3 (three) times daily. (Patient not taking: Reported on 07/27/2019), Disp: 3 tablet, Rfl: 0 .  linaclotide (LINZESS) 145 MCG CAPS  capsule, Take 1 capsule (145 mcg total) by mouth daily before breakfast. (Patient not taking: Reported on 07/27/2019), Disp: 30 capsule, Rfl: 0  EXAM:  VITALS per patient if applicable:  GENERAL: alert, oriented, appears well and in no acute distress  HEENT: atraumatic, conjunttiva clear, no obvious abnormalities on inspection of external nose and ears  NECK: normal movements of the head and neck  LUNGS: on inspection no signs of respiratory distress, breathing rate appears normal, no obvious gross SOB, gasping or wheezing  CV: no obvious cyanosis  MS: moves all visible extremities without noticeable abnormality  PSYCH/NEURO: pleasant and cooperative, no obvious  depression or anxiety, speech and thought processing grossly intact  ASSESSMENT AND PLAN: Left shoulder pain. He can use Meloxicam daily. Refer to Orthopedics.  Alysia Penna, MD  Discussed the following assessment and plan:  Chronic left shoulder pain - Plan: Ambulatory referral to Orthopedic Surgery     I discussed the assessment and treatment plan with the patient. The patient was provided an opportunity to ask questions and all were answered. The patient agreed with the plan and demonstrated an understanding of the instructions.   The patient was advised to call back or seek an in-person evaluation if the symptoms worsen or if the condition fails to improve as anticipated.

## 2019-07-28 ENCOUNTER — Other Ambulatory Visit: Payer: Self-pay | Admitting: Neurology

## 2019-07-28 MED ORDER — AMITRIPTYLINE HCL 100 MG PO TABS: 100.0000 mg | ORAL_TABLET | Freq: Every day | ORAL | 0 refills | Status: DC

## 2019-07-28 NOTE — Telephone Encounter (Signed)
Refilled one prescription (30 day supply).  Patient must be made aware to make a follow up appointment for further refills.

## 2019-07-31 ENCOUNTER — Ambulatory Visit: Payer: Medicare Other | Admitting: Neurology

## 2019-08-03 DIAGNOSIS — M25512 Pain in left shoulder: Secondary | ICD-10-CM | POA: Diagnosis not present

## 2019-08-03 DIAGNOSIS — M25511 Pain in right shoulder: Secondary | ICD-10-CM | POA: Diagnosis not present

## 2019-08-05 NOTE — Progress Notes (Signed)
Virtual Visit via Video Note The purpose of this virtual visit is to provide medical care while limiting exposure to the novel coronavirus.    Consent was obtained for video visit:  Yes.   Answered questions that patient had about telehealth interaction:  Yes.   I discussed the limitations, risks, security and privacy concerns of performing an evaluation and management service by telemedicine. I also discussed with the patient that there may be a patient responsible charge related to this service. The patient expressed understanding and agreed to proceed.  Pt location: Home Physician Location: Home Name of referring provider:  Laurey Morale, MD I connected with [name] at patients initiation/request on 08/06/2019 at 10:10 AM EDT by video enabled telemedicine application and verified that I am speaking with the correct person using two identifiers. Pt MRN:  [id number] Pt DOB:  [birth year] Video Participants:  [name]   History of Present Illness:  [name] is a 73 year old male with type 2 diabetes mellitus, IBS, coronary artery disease, hypertension, hyperlipidemia, who follows up for diabetic polyneuropathy.  UPDATE: Over the past year, he has tapered down on the gabapentin.  He isnow  taking gabapentin 1500mg  at bedtime; amitriptyline 100mg  at bedtime.  He is also use capsaicin spray.  Pain is stable   HISTORY: He has longstanding history of uncontrolled diabetes mellitus not on medication.  Up until tn 2019, Hgb A1c had been over 14.  Last Hgb A1c from November 2019 was 7.9.  He developed nerve pain 8 months ago.  He reports allodynia over the abdomen and radiating to the back.  Driving over a bump will trigger pain.  He also reports burning and numbness in the legs from the feet up to the knees.  There is sometimes sharp pains in the legs as well.  Over this past year, Hgb A1c has trended down.  Most recent lab from 09/11/18 was 7.9.   Past medications include:  none   Past Medical History: Past Medical History:  Diagnosis Date  . Allergy   . BPH (benign prostatic hyperplasia)    se[facility] Dr. Peterson Lombard in [facility]   . CAD (coronary artery disease)   . Depression   . Diabetes mellitus   . Elevated PSA    se[facility] Dr. Peterson Lombard in [facility]  . GERD (gastroesophageal reflux disease)   . Hip pain, bilateral    sees Dr. Alvan Dame   . Hyperlipidemia   . Hypertension   . Hypogonadism male   . Insomnia     Medications: Outpatient Encounter Medications as of 08/06/2019  Medication Sig  . amitriptyline (ELAVIL) 100 MG tablet TAKE 1 TABLET BY MOUTH EVERYDAY AT BEDTIME  . amitriptyline (ELAVIL) 100 MG tablet Take 1 tablet (100 mg total) by mouth at bedtime.  . Cinnamon 500 MG TABS Take 2 tablets (1,000 mg total) by mouth 3 (three) times daily. (Patient not taking: Reported on 07/27/2019)  . dicyclomine (BENTYL) 20 MG tablet Take 1 tablet (20 mg total) by mouth 3 (three) times daily before meals.  . gabapentin (NEURONTIN) 300 MG capsule TAKE 3 CAPSULES IN THE MORNING, 2 CAPSULES AT NOON, AND 6 CAPSULES AT BEDTIME  . linaclotide (LINZESS) 145 MCG CAPS capsule Take 1 capsule (145 mcg total) by mouth daily before breakfast. (Patient not taking: Reported on 07/27/2019)  . meloxicam (MOBIC) 15 MG tablet Take 1 tablet (15 mg total) by mouth daily.  . naproxen (NAPROSYN) 500 MG tablet TAKE 1 TABLET (500  MG TOTAL) BY MOUTH 2 (TWO) TIMES DAILY WITH A MEAL.  Marland Kitchen omeprazole (PRILOSEC) 20 MG capsule Take 1 capsule (20 mg total) by mouth daily.  Marland Kitchen STEGLUJAN 5-100 MG TABS TAKE 1 TABLET BY MOUTH EVERY DAY  . tadalafil (CIALIS) 5 MG tablet Take 1 tablet (5 mg total) by mouth daily.  . valsartan-hydrochlorothiazide (DIOVAN-HCT) 80-12.5 MG tablet TAKE 1 TABLET BY MOUTH EVERY DAY  . [DISCONTINUED] lisinopril-hydrochlorothiazide (PRINZIDE,ZESTORETIC) 20-25 MG per tablet Take 1 tablet by mouth daily.   No facility-administered encounter medications on file as of 08/06/2019.      Allergies: Allergies  Allergen Reactions  . Codeine Phosphate     REACTION: vomiting    Family History: Family History  Problem Relation Age of Onset  . Depression Unknown   . Bowel Disease Mother 54  . Heart attack Father 16  . Osteoporosis Sister   . Coronary artery disease Brother   . Diabetes Neg Hx     Social History: Social History   Socioeconomic History  . Marital status: Married    Spouse name: Shirlee Limerick  . Number of children: 4  . Years of education: Not on file  . Highest education level: Associate degree: occupational, Hotel manager, or vocational program  Occupational History  . Occupation: retired  Scientific laboratory technician  . Financial resource strain: Not on file  . Food insecurity    Worry: Not on file    Inability: Not on file  . Transportation needs    Medical: Not on file    Non-medical: Not on file  Tobacco Use  . Smoking status: Former Smoker    Packs/day: 0.50    Years: 15.00    Pack years: 7.50    Types: Cigarettes    Quit date: 2010    Years since quitting: 10.7  . Smokeless tobacco: Never Used  Substance and Sexual Activity  . Alcohol use: No    Alcohol/week: 0.0 standard drinks  . Drug use: No  . Sexual activity: Not on file  Lifestyle  . Physical activity    Days per week: Not on file    Minutes per session: Not on file  . Stress: Not on file  Relationships  . Social Herbalist on phone: Not on file    Gets together: Not on file    Attends religious service: Not on file    Active member of club or organization: Not on file    Attends meetings of clubs or organizations: Not on file    Relationship status: Not on file  . Intimate partner violence    Fear of current or ex partner: Not on file    Emotionally abused: Not on file    Physically abused: Not on file    Forced sexual activity: Not on file  Other Topics Concern  . Not on file  Social History Narrative   Patient is right-handed. He lives with his wife in a 2 story house.  He drinks 2 cups of coffee a day, and an occasionally soda. He does not exercise.    Observations/Objective:   Height 5\' 8"  (1.727 m), weight 230 lb (104.3 kg).  No acute distress.  Alert and oriented.  Speech fluent and not dysarthric.  Language intact.  Eyes orthophoric on primary gaze.  Face symmetric.  Assessment and Plan:   Diabetic polyneuropathy  1.  Gabapentin 1500mg  at bedtime.  He has option to reduce dose as needed. 2.  Follow up in one year.  Follow  Up Instructions:    -I discussed the assessment and treatment plan with the patient. The patient was provided an opportunity to ask questions and all were answered. The patient agreed with the plan and demonstrated an understanding of the instructions.   The patient was advised to call back or seek an in-person evaluation if the symptoms worsen or if the condition fails to improve as anticipated.    Total Time spent in visit with the patient was:  25 minutes   Dudley Major, DO

## 2019-08-06 ENCOUNTER — Encounter: Payer: Self-pay | Admitting: Neurology

## 2019-08-06 ENCOUNTER — Other Ambulatory Visit: Payer: Self-pay

## 2019-08-06 ENCOUNTER — Telehealth (INDEPENDENT_AMBULATORY_CARE_PROVIDER_SITE_OTHER): Payer: Medicare Other | Admitting: Neurology

## 2019-08-06 VITALS — Ht 68.0 in | Wt 230.0 lb

## 2019-08-06 DIAGNOSIS — E1142 Type 2 diabetes mellitus with diabetic polyneuropathy: Secondary | ICD-10-CM

## 2019-09-02 ENCOUNTER — Other Ambulatory Visit: Payer: Self-pay | Admitting: Family Medicine

## 2019-09-14 ENCOUNTER — Encounter: Payer: Medicare Other | Admitting: Family Medicine

## 2019-10-19 ENCOUNTER — Telehealth: Payer: Self-pay | Admitting: *Deleted

## 2019-10-19 NOTE — Telephone Encounter (Signed)
Copied from [place] [id number]. Topic: Appointment Scheduling - Scheduling Inquiry for Clinic >> Oct 19, 2019  3:26 PM Rayann Heman wrote: Reason for CRM: [name] called and stated that he would like to schedule a virtual visit to follow up. [name] also states that he has lost taste buds. Unable to reach office.  Please advise

## 2019-10-23 ENCOUNTER — Telehealth (INDEPENDENT_AMBULATORY_CARE_PROVIDER_SITE_OTHER): Payer: Medicare Other | Admitting: Family Medicine

## 2019-10-23 ENCOUNTER — Encounter: Payer: Self-pay | Admitting: Family Medicine

## 2019-10-23 DIAGNOSIS — R432 Parageusia: Secondary | ICD-10-CM | POA: Diagnosis not present

## 2019-10-23 NOTE — Progress Notes (Signed)
Virtual Visit via Video Note  I connected with the patient on 10/23/19 at  1:00 PM EST by a video enabled telemedicine application and verified that I am speaking with the correct person using two identifiers.  Location patient: home Location provider:work or home office Persons participating in the virtual visit: patient, provider  I discussed the limitations of evaluation and management by telemedicine and the availability of in person appointments. The patient expressed understanding and agreed to proceed.   HPI: For the past 3 months he has noticed changes in his sense of taste. It has never gone away completely, but for a few months everything tasted salty, and now everything tastes sweet to him. No change in his sense of smell. No other Covid-19 symptoms like headache or fever or cough or SOB or body aches or NVD. No recent changes in his medications. He also has known severe neuropathy which so far has been confined to his legs. He sees Dr. Tomi Likens for this, and he currently takes Gabapentin and Amitriptyline for that with poor response.    ROS: See pertinent positives and negatives per HPI.  Past Medical History:  Diagnosis Date  . Allergy   . BPH (benign prostatic hyperplasia)    sees Dr. Peterson Lombard in [facility]   . CAD (coronary artery disease)   . Depression   . Diabetes mellitus   . Elevated PSA    sees Dr. Peterson Lombard in [facility]  . GERD (gastroesophageal reflux disease)   . Hip pain, bilateral    sees Dr. Alvan Dame   . Hyperlipidemia   . Hypertension   . Hypogonadism male   . Insomnia     Past Surgical History:  Procedure Laterality Date  . CHOLECYSTECTOMY    . COLONOSCOPY  12/16/2017   per Dr. Lucienne Capers at [place], single benign polyp, sigmoid diverticulosis, hemorrhoids   . ESOPHAGOGASTRODUODENOSCOPY  12/16/2017   per Dr. Shary Key at [place], gastritis and duodenitis   . HERNIA REPAIR     umbilical  . TRANSURETHRAL RESECTION OF PROSTATE  January 2010   per  Dr. Ky Barban    Family History  Problem Relation Age of Onset  . Depression Other   . Bowel Disease Mother 28  . Heart attack Father 38  . Osteoporosis Sister   . Coronary artery disease Brother   . Diabetes Neg Hx      Current Outpatient Medications:  .  amitriptyline (ELAVIL) 100 MG tablet, TAKE 1 TABLET BY MOUTH EVERYDAY AT BEDTIME, Disp: 90 tablet, Rfl: 0 .  Cinnamon 500 MG TABS, Take 2 tablets (1,000 mg total) by mouth 3 (three) times daily. (Patient not taking: Reported on 07/27/2019), Disp: 3 tablet, Rfl: 0 .  dicyclomine (BENTYL) 20 MG tablet, Take 1 tablet (20 mg total) by mouth 3 (three) times daily before meals., Disp: 3 tablet, Rfl: 0 .  gabapentin (NEURONTIN) 300 MG capsule, TAKE 3 CAPSULES IN THE MORNING, 2 CAPSULES AT NOON, AND 6 CAPSULES AT BEDTIME, Disp: 330 capsule, Rfl: 5 .  linaclotide (LINZESS) 145 MCG CAPS capsule, Take 1 capsule (145 mcg total) by mouth daily before breakfast. (Patient not taking: Reported on 07/27/2019), Disp: 30 capsule, Rfl: 0 .  meloxicam (MOBIC) 15 MG tablet, Take 1 tablet (15 mg total) by mouth daily., Disp: 30 tablet, Rfl: 11 .  naproxen (NAPROSYN) 500 MG tablet, TAKE 1 TABLET (500 MG TOTAL) BY MOUTH 2 (TWO) TIMES DAILY WITH A MEAL., Disp: 60 tablet, Rfl: 11 .  omeprazole (PRILOSEC) 20  MG capsule, Take 1 capsule (20 mg total) by mouth daily., Disp: 30 capsule, Rfl: 11 .  STEGLUJAN 5-100 MG TABS, TAKE 1 TABLET BY MOUTH EVERY DAY, Disp: 30 tablet, Rfl: 2 .  tadalafil (CIALIS) 5 MG tablet, Take 1 tablet (5 mg total) by mouth daily. (Patient not taking: Reported on 08/06/2019), Disp: 30 tablet, Rfl: 11 .  traMADol (ULTRAM) 50 MG tablet, , Disp: , Rfl:  .  valsartan-hydrochlorothiazide (DIOVAN-HCT) 80-12.5 MG tablet, TAKE 1 TABLET BY MOUTH EVERY DAY, Disp: 90 tablet, Rfl: 0  EXAM:  VITALS per patient if applicable:  GENERAL: alert, oriented, appears well and in no acute distress  HEENT: atraumatic, conjunttiva clear, no obvious abnormalities on  inspection of external nose and ears  NECK: normal movements of the head and neck  LUNGS: on inspection no signs of respiratory distress, breathing rate appears normal, no obvious gross SOB, gasping or wheezing  CV: no obvious cyanosis  MS: moves all visible extremities without noticeable abnormality  PSYCH/NEURO: pleasant and cooperative, no obvious depression or anxiety, speech and thought processing grossly intact  ASSESSMENT AND PLAN: The changes in his sense of taste may be a form of neuropathy, so I asked him to check with Dr. Tomi Likens about this. I am not aware of anything else he can do about this.  Alysia Penna, MD  Discussed the following assessment and plan:  No diagnosis found.     I discussed the assessment and treatment plan with the patient. The patient was provided an opportunity to ask questions and all were answered. The patient agreed with the plan and demonstrated an understanding of the instructions.   The patient was advised to call back or seek an in-person evaluation if the symptoms worsen or if the condition fails to improve as anticipated.

## 2019-11-02 ENCOUNTER — Other Ambulatory Visit: Payer: Self-pay | Admitting: Family Medicine

## 2019-11-27 ENCOUNTER — Other Ambulatory Visit: Payer: Self-pay | Admitting: Neurology

## 2019-12-04 ENCOUNTER — Other Ambulatory Visit: Payer: Self-pay | Admitting: Family Medicine

## 2019-12-08 ENCOUNTER — Other Ambulatory Visit: Payer: Self-pay

## 2019-12-08 ENCOUNTER — Telehealth (INDEPENDENT_AMBULATORY_CARE_PROVIDER_SITE_OTHER): Payer: Medicare Other | Admitting: Family Medicine

## 2019-12-08 DIAGNOSIS — M25512 Pain in left shoulder: Secondary | ICD-10-CM | POA: Diagnosis not present

## 2019-12-08 DIAGNOSIS — G8929 Other chronic pain: Secondary | ICD-10-CM | POA: Diagnosis not present

## 2019-12-08 DIAGNOSIS — M25511 Pain in right shoulder: Secondary | ICD-10-CM | POA: Diagnosis not present

## 2019-12-08 MED ORDER — HYDROCODONE-ACETAMINOPHEN 5-325 MG PO TABS: 1.0000 | ORAL_TABLET | Freq: Four times a day (QID) | ORAL | 0 refills | Status: DC | PRN

## 2019-12-08 NOTE — Progress Notes (Signed)
Virtual Visit via Video Note  I connected with the patient on 12/08/19 at  3:30 PM EST by a video enabled telemedicine application and verified that I am speaking with the correct person using two identifiers.  Location patient: home Location provider:work or home office Persons participating in the virtual visit: patient, provider  I discussed the limitations of evaluation and management by telemedicine and the availability of in person appointments. The patient expressed understanding and agreed to proceed.   HPI: Here requesting pain medication for his shoulders. He has daily pain in both shoulders, the left being worse than the right. He has tried Ibuprofen and Tramadol with no relief. 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 He was given a steroid injection into the left shoulder but this do nothing for him. Then he saw his dentist to have a tooth pulled and he was given a few hydrocodone pills for the pain. He found that these also did a great job in relieving his shoulder pain. He asks id he can continue to use these.    ROS: See pertinent positives and negatives per HPI.  Past Medical History:  Diagnosis Date  . Allergy   . BPH (benign prostatic hyperplasia)    sees Dr. Peterson Lombard in [facility]   . CAD (coronary artery disease)   . Depression   . Diabetes mellitus   . Elevated PSA    sees Dr. Peterson Lombard in [facility]  . GERD (gastroesophageal reflux disease)   . Hip pain, bilateral    sees Dr. Alvan Dame   . Hyperlipidemia   . Hypertension   . Hypogonadism male   . Insomnia     Past Surgical History:  Procedure Laterality Date  . CHOLECYSTECTOMY    . COLONOSCOPY  12/16/2017   per Dr. Lucienne Capers at [place], single benign polyp, sigmoid diverticulosis, hemorrhoids   . ESOPHAGOGASTRODUODENOSCOPY  12/16/2017   per Dr. Shary Key at [place], gastritis and duodenitis   . HERNIA REPAIR     umbilical  . TRANSURETHRAL  RESECTION OF PROSTATE  January 2010   per Dr. Ky Barban    Family History  Problem Relation Age of Onset  . Depression Other   . Bowel Disease Mother 9  . Heart attack Father 63  . Osteoporosis Sister   . Coronary artery disease Brother   . Diabetes Neg Hx      Current Outpatient Medications:  .  amitriptyline (ELAVIL) 100 MG tablet, TAKE 1 TABLET BY MOUTH EVERYDAY AT BEDTIME, Disp: 90 tablet, Rfl: 0 .  amitriptyline (ELAVIL) 100 MG tablet, TAKE 1 TABLET BY MOUTH EVERYDAY AT BEDTIME, Disp: 30 tablet, Rfl: 1 .  Cinnamon 500 MG TABS, Take 2 tablets (1,000 mg total) by mouth 3 (three) times daily. (Patient not taking: Reported on 07/27/2019), Disp: 3 tablet, Rfl: 0 .  dicyclomine (BENTYL) 20 MG tablet, Take 1 tablet (20 mg total) by mouth 3 (three) times daily before meals., Disp: 3 tablet, Rfl: 0 .  gabapentin (NEURONTIN) 300 MG capsule, TAKE 3 CAPSULES IN THE MORNING, 2 CAPSULES AT NOON, AND 6 CAPSULES AT BEDTIME, Disp: 330 capsule, Rfl: 5 .  HYDROcodone-acetaminophen (NORCO) 5-325 MG tablet, Take 1 tablet by mouth every 6 (six) hours as needed for up to 5 days for moderate pain., Disp: 20 tablet, Rfl: 0 .  linaclotide (LINZESS) 145 MCG CAPS capsule, Take 1 capsule (145 mcg total) by mouth daily before breakfast. (Patient not taking: Reported  on 07/27/2019), Disp: 30 capsule, Rfl: 0 .  meloxicam (MOBIC) 15 MG tablet, Take 1 tablet (15 mg total) by mouth daily., Disp: 30 tablet, Rfl: 11 .  naproxen (NAPROSYN) 500 MG tablet, TAKE 1 TABLET (500 MG TOTAL) BY MOUTH 2 (TWO) TIMES DAILY WITH A MEAL., Disp: 60 tablet, Rfl: 11 .  omeprazole (PRILOSEC) 20 MG capsule, Take 1 capsule (20 mg total) by mouth daily., Disp: 30 capsule, Rfl: 11 .  STEGLUJAN 5-100 MG TABS, TAKE 1 TABLET BY MOUTH EVERY DAY, Disp: 30 tablet, Rfl: 2 .  tadalafil (CIALIS) 5 MG tablet, Take 1 tablet (5 mg total) by mouth daily. (Patient not taking: Reported on 08/06/2019), Disp: 30 tablet, Rfl: 11 .  traMADol (ULTRAM) 50 MG tablet, ,  Disp: , Rfl:  .  valsartan-hydrochlorothiazide (DIOVAN-HCT) 80-12.5 MG tablet, TAKE 1 TABLET BY MOUTH EVERY DAY, Disp: 90 tablet, Rfl: 0  EXAM:  VITALS per patient if applicable:  GENERAL: alert, oriented, appears well and in no acute distress  HEENT: atraumatic, conjunttiva clear, no obvious abnormalities on inspection of external nose and ears  NECK: normal movements of the head and neck  LUNGS: on inspection no signs of respiratory distress, breathing rate appears normal, no obvious gross SOB, gasping or wheezing  CV: no obvious cyanosis  MS: moves all visible extremities without noticeable abnormality  PSYCH/NEURO: pleasant and cooperative, no obvious depression or anxiety, speech and thought processing grossly intact  ASSESSMENT AND PLAN: Shoulder pain, we did send in a 5 day supply of Norco 5-325. We agreed to start him in the pain management protocol. He will come into the office later this week for a UDS and to sign a contract, etc.  Alysia Penna, MD  Discussed the following assessment and plan:  No diagnosis found.     I discussed the assessment and treatment plan with the patient. The patient was provided an opportunity to ask questions and all were answered. The patient agreed with the plan and demonstrated an understanding of the instructions.   The patient was advised to call back or seek an in-person evaluation if the symptoms worsen or if the condition fails to improve as anticipated.

## 2019-12-10 ENCOUNTER — Ambulatory Visit: Payer: Medicare Other | Admitting: Family Medicine

## 2019-12-10 ENCOUNTER — Other Ambulatory Visit: Payer: Self-pay

## 2019-12-10 ENCOUNTER — Encounter: Payer: Self-pay | Admitting: Family Medicine

## 2019-12-10 VITALS — BP 110/58 | Temp 97.2°F | Wt 245.0 lb

## 2019-12-10 DIAGNOSIS — F1190 Opioid use, unspecified, uncomplicated: Secondary | ICD-10-CM | POA: Diagnosis not present

## 2019-12-10 DIAGNOSIS — M25512 Pain in left shoulder: Secondary | ICD-10-CM

## 2019-12-10 DIAGNOSIS — M25511 Pain in right shoulder: Secondary | ICD-10-CM | POA: Diagnosis not present

## 2019-12-10 DIAGNOSIS — G8929 Other chronic pain: Secondary | ICD-10-CM | POA: Diagnosis not present

## 2019-12-10 MED ORDER — HYDROCODONE-ACETAMINOPHEN 5-325 MG PO TABS: 1.0000 | ORAL_TABLET | Freq: Four times a day (QID) | ORAL | 0 refills | Status: DC | PRN

## 2019-12-10 NOTE — Progress Notes (Signed)
   Subjective:    Patient ID: [name], male    DOB: 04/02/1946, 74 y.o.   MRN: [id number]  HPI Here for pain management. He has chronic bilateral shoulder pain, and this responds well to a combination of Meloxicam and Norco. Indication for chronic opioid: shoulder pain Medication and dose: Norco 5-325 # pills per month: 120 Last UDS date: 12-10-19 Opioid Treatment Agreement signed (Y/N): 12-10-19 Opioid Treatment Agreement last reviewed with patient:  12-10-19 [facility] reviewed this encounter (include red flags):  12-10-19    Review of Systems     Objective:   Physical Exam        Assessment & Plan:  Pain management, meds were refilled.  Alysia Penna, MD

## 2019-12-11 DIAGNOSIS — K769 Liver disease, unspecified: Secondary | ICD-10-CM | POA: Diagnosis not present

## 2019-12-11 DIAGNOSIS — K5900 Constipation, unspecified: Secondary | ICD-10-CM | POA: Diagnosis not present

## 2019-12-11 DIAGNOSIS — K589 Irritable bowel syndrome without diarrhea: Secondary | ICD-10-CM | POA: Diagnosis not present

## 2019-12-12 LAB — PAIN MGMT, PROFILE 8 W/CONF, U
6 Acetylmorphine: NEGATIVE ng/mL
Alcohol Metabolites: NEGATIVE ng/mL
Amphetamines: NEGATIVE ng/mL
Benzodiazepines: NEGATIVE ng/mL
Buprenorphine, Urine: NEGATIVE ng/mL
Cocaine Metabolite: NEGATIVE ng/mL
Codeine: NEGATIVE ng/mL
Creatinine: 48.6 mg/dL
Hydrocodone: 696 ng/mL
Hydromorphone: 201 ng/mL
MDMA: NEGATIVE ng/mL
Marijuana Metabolite: NEGATIVE ng/mL
Morphine: NEGATIVE ng/mL
Norhydrocodone: 707 ng/mL
Opiates: POSITIVE ng/mL
Oxidant: NEGATIVE ug/mL
Oxycodone: NEGATIVE ng/mL
pH: 5.2 (ref 4.5–9.0)

## 2019-12-17 DIAGNOSIS — K769 Liver disease, unspecified: Secondary | ICD-10-CM | POA: Diagnosis not present

## 2019-12-17 DIAGNOSIS — N281 Cyst of kidney, acquired: Secondary | ICD-10-CM | POA: Diagnosis not present

## 2019-12-31 ENCOUNTER — Other Ambulatory Visit: Payer: Self-pay | Admitting: Family Medicine

## 2020-01-08 ENCOUNTER — Other Ambulatory Visit: Payer: Self-pay

## 2020-01-08 ENCOUNTER — Telehealth: Payer: Self-pay | Admitting: Family Medicine

## 2020-01-08 ENCOUNTER — Telehealth (INDEPENDENT_AMBULATORY_CARE_PROVIDER_SITE_OTHER): Payer: Medicare Other | Admitting: Family Medicine

## 2020-01-08 DIAGNOSIS — G8929 Other chronic pain: Secondary | ICD-10-CM

## 2020-01-08 DIAGNOSIS — M25511 Pain in right shoulder: Secondary | ICD-10-CM

## 2020-01-08 DIAGNOSIS — M25512 Pain in left shoulder: Secondary | ICD-10-CM

## 2020-01-08 MED ORDER — HYDROCODONE-ACETAMINOPHEN 10-325 MG PO TABS: 1.0000 | ORAL_TABLET | Freq: Four times a day (QID) | ORAL | 0 refills | Status: DC | PRN

## 2020-01-08 NOTE — Progress Notes (Signed)
Virtual Visit via Telephone Note  I connected with the patient on 01/08/20 at  3:00 PM EST by telephone and verified that I am speaking with the correct person using two identifiers.   I discussed the limitations, risks, security and privacy concerns of performing an evaluation and management service by telephone and the availability of in person appointments. I also discussed with the patient that there may be a patient responsible charge related to this service. The patient expressed understanding and agreed to proceed.  Location patient: home Location provider: work or home office Participants present for the call: patient, provider Patient did not have a visit in the prior 7 days to address this/these issue(s).   History of Present Illness: Here to discuss his pain medication. Last month he took Norco 5-325 four times daily, and it helped. However he asks if the dose can be increased a bit for better pain control.    Observations/Objective: Patient sounds cheerful and well on the phone. I do not appreciate any SOB. Speech and thought processing are grossly intact. Patient reported vitals:  Assessment and Plan: Pain management, will increase the Norco to 10-325 four times a day as needed.  Alysia Penna, MD   Follow Up Instructions:     [phone] 5-10 [phone] 11-20 [id number] 21-30 I did not refer this patient for an OV in the next 24 hours for this/these issue(s).  I discussed the assessment and treatment plan with the patient. The patient was provided an opportunity to ask questions and all were answered. The patient agreed with the plan and demonstrated an understanding of the instructions.   The patient was advised to call back or seek an in-person evaluation if the symptoms worsen or if the condition fails to improve as anticipated. I provided 12 minutes of non-face-to-face time during this encounter.   Alysia Penna, MD

## 2020-01-11 DIAGNOSIS — N400 Enlarged prostate without lower urinary tract symptoms: Secondary | ICD-10-CM | POA: Diagnosis not present

## 2020-01-11 DIAGNOSIS — N2889 Other specified disorders of kidney and ureter: Secondary | ICD-10-CM | POA: Diagnosis not present

## 2020-01-11 NOTE — Telephone Encounter (Signed)
error 

## 2020-01-18 ENCOUNTER — Encounter: Payer: Self-pay | Admitting: Family Medicine

## 2020-01-18 ENCOUNTER — Telehealth: Payer: Medicare Other | Admitting: Family Medicine

## 2020-01-18 VITALS — Ht 68.0 in | Wt 230.0 lb

## 2020-01-18 DIAGNOSIS — R531 Weakness: Secondary | ICD-10-CM

## 2020-01-18 DIAGNOSIS — R296 Repeated falls: Secondary | ICD-10-CM

## 2020-01-18 DIAGNOSIS — E1140 Type 2 diabetes mellitus with diabetic neuropathy, unspecified: Secondary | ICD-10-CM

## 2020-01-18 NOTE — Progress Notes (Signed)
Here to discuss some odd feelings he has ben having for several weeks. He feels weak and lightheaded at times, and he fell twice over the weekend. He had fallen asleep in his recliner at his house and he woke up at 2 am. As he got up out of the chair to go to the kitchen, he suddenly felt weak and fell to the floor. No injuries .then he tried to get up 15 minutes later and he fell again, again with no injuries. There was no LOC, and his wife was at the scene during the second fall to help him get up. Since then he has felt fine, but he says he has trouble focusing his thoughts at times and he often forgets what he intends to say. 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 heis now taking 5 capsules at bedtime and none during the day. His glucoses have been very labile, from 90 to 180.  Virtual Visit via Video Note  I connected with the patient on 01/18/20 at  4:00 PM EDT by a video enabled telemedicine application and verified that I am speaking with the correct person using two identifiers.  Location patient: home Location provider:work or home office Persons participating in the virtual visit: patient, provider  I discussed the limitations of evaluation and management by telemedicine and the availability of in person appointments. The patient expressed understanding and agreed to proceed.   HPI:    ROS: See pertinent positives and negatives per HPI.  Past Medical History:  Diagnosis Date  . Allergy   . BPH (benign prostatic hyperplasia)    sees Dr. Peterson Lombard in [facility]   . CAD (coronary artery disease)   . Depression   . Diabetes mellitus   . Elevated PSA    sees Dr. Peterson Lombard in [facility]  . GERD (gastroesophageal reflux disease)   . Hip pain, bilateral    sees Dr. Alvan Dame   . Hyperlipidemia   . Hypertension   . Hypogonadism male   . Insomnia     Past Surgical History:  Procedure  Laterality Date  . CHOLECYSTECTOMY    . COLONOSCOPY  12/16/2017   per Dr. Lucienne Capers at [place], single benign polyp, sigmoid diverticulosis, hemorrhoids   . ESOPHAGOGASTRODUODENOSCOPY  12/16/2017   per Dr. Shary Key at [place], gastritis and duodenitis   . HERNIA REPAIR     umbilical  . TRANSURETHRAL RESECTION OF PROSTATE  January 2010   per Dr. Ky Barban    Family History  Problem Relation Age of Onset  . Depression Other   . Bowel Disease Mother 56  . Heart attack Father 26  . Osteoporosis Sister   . Coronary artery disease Brother   . Diabetes Neg Hx      Current Outpatient Medications:  .  amitriptyline (ELAVIL) 100 MG tablet, TAKE 1 TABLET BY MOUTH EVERYDAY AT BEDTIME, Disp: 90 tablet, Rfl: 0 .  Cinnamon 500 MG TABS, Take 2 tablets (1,000 mg total) by mouth 3 (three) times daily., Disp: 3 tablet, Rfl: 0 .  dicyclomine (BENTYL) 20 MG tablet, Take 1 tablet (20 mg total) by mouth 3 (three) times daily before meals., Disp: 3 tablet, Rfl: 0 .  gabapentin (NEURONTIN) 300 MG capsule, TAKE 3 CAPSULES IN THE MORNING, 2 CAPSULES AT NOON, AND 6 CAPSULES AT BEDTIME, Disp: 330 capsule, Rfl: 5 .  [START ON 03/09/2020] HYDROcodone-acetaminophen ([place])  10-325 MG tablet, Take 1 tablet by mouth every 6 (six) hours as needed for moderate pain., Disp: 120 tablet, Rfl: 0 .  linaclotide (LINZESS) 145 MCG CAPS capsule, Take 1 capsule (145 mcg total) by mouth daily before breakfast., Disp: 30 capsule, Rfl: 0 .  meloxicam (MOBIC) 15 MG tablet, Take 1 tablet (15 mg total) by mouth daily., Disp: 30 tablet, Rfl: 11 .  naproxen (NAPROSYN) 500 MG tablet, TAKE 1 TABLET (500 MG TOTAL) BY MOUTH 2 (TWO) TIMES DAILY WITH A MEAL., Disp: 60 tablet, Rfl: 11 .  omeprazole (PRILOSEC) 20 MG capsule, Take 1 capsule (20 mg total) by mouth daily., Disp: 30 capsule, Rfl: 11 .  STEGLUJAN 5-100 MG TABS, TAKE 1 TABLET BY MOUTH EVERY DAY, Disp: 30 tablet, Rfl: 2 .  tadalafil (CIALIS) 5 MG tablet, Take 1 tablet (5 mg total) by  mouth daily., Disp: 30 tablet, Rfl: 11 .  traMADol (ULTRAM) 50 MG tablet, , Disp: , Rfl:  .  valsartan-hydrochlorothiazide (DIOVAN-HCT) 80-12.5 MG tablet, TAKE 1 TABLET BY MOUTH EVERY DAY, Disp: 30 tablet, Rfl: 2  EXAM:  VITALS per patient if applicable:  GENERAL: alert, oriented, appears well and in no acute distress  HEENT: atraumatic, conjunttiva clear, no obvious abnormalities on inspection of external nose and ears  NECK: normal movements of the head and neck  LUNGS: on inspection no signs of respiratory distress, breathing rate appears normal, no obvious gross SOB, gasping or wheezing  CV: no obvious cyanosis  MS: moves all visible extremities without noticeable abnormality  PSYCH/NEURO: pleasant and cooperative, no obvious depression or anxiety, speech and thought processing grossly intact  ASSESSMENT AND PLAN: He has not been feeling well and he has fallen twice in the past week. The etiology is not clear, but I suspect medication side effects are to blame. He will come into the office tomorrow morning for a more complete exam and lab work.  Alysia Penna, MD  Discussed the following assessment and plan:  No diagnosis found.     I discussed the assessment and treatment plan with the patient. The patient was provided an opportunity to ask questions and all were answered. The patient agreed with the plan and demonstrated an understanding of the instructions.   The patient was advised to call back or seek an in-person evaluation if the symptoms worsen or if the condition fails to improve as anticipated.

## 2020-01-19 ENCOUNTER — Ambulatory Visit (INDEPENDENT_AMBULATORY_CARE_PROVIDER_SITE_OTHER): Payer: Medicare Other | Admitting: Family Medicine

## 2020-01-19 ENCOUNTER — Other Ambulatory Visit: Payer: Self-pay

## 2020-01-19 ENCOUNTER — Encounter: Payer: Self-pay | Admitting: Family Medicine

## 2020-01-19 VITALS — BP 122/70 | HR 91 | Temp 97.4°F | Ht 68.0 in | Wt 232.0 lb

## 2020-01-19 DIAGNOSIS — R531 Weakness: Secondary | ICD-10-CM | POA: Diagnosis not present

## 2020-01-19 DIAGNOSIS — E1140 Type 2 diabetes mellitus with diabetic neuropathy, unspecified: Secondary | ICD-10-CM

## 2020-01-19 LAB — BASIC METABOLIC PANEL
BUN: 19 mg/dL (ref 6–23)
CO2: 22 mEq/L (ref 19–32)
Calcium: 10.3 mg/dL (ref 8.4–10.5)
Chloride: 98 mEq/L (ref 96–112)
Creatinine, Ser: 0.69 mg/dL (ref 0.40–1.50)
GFR: 111.94 mL/min (ref >60.00)
Glucose, Bld: 214 mg/dL — ABNORMAL HIGH (ref 70–99)
Potassium: 4.5 mEq/L (ref 3.5–5.1)
Sodium: 134 mEq/L — ABNORMAL LOW (ref 135–145)

## 2020-01-19 LAB — URINALYSIS, ROUTINE W REFLEX MICROSCOPIC
Hgb urine dipstick: NEGATIVE
Ketones, ur: >=80 — AB
Leukocytes,Ua: NEGATIVE
Nitrite: NEGATIVE
RBC / HPF: NONE SEEN (ref 0–?)
Specific Gravity, Urine: 1.025 (ref 1.000–1.030)
Total Protein, Urine: 100 — AB
Urine Glucose: >=1000 — AB
Urobilinogen, UA: 0.2 (ref 0.0–1.0)
pH: 5.5 (ref 5.0–8.0)

## 2020-01-19 LAB — TSH: TSH: 0.43 u[IU]/mL (ref 0.35–4.50)

## 2020-01-19 LAB — CBC WITH DIFFERENTIAL/PLATELET
Basophils Absolute: 0.1 10*3/uL (ref 0.0–0.1)
Basophils Relative: 0.5 % (ref 0.0–3.0)
Eosinophils Absolute: 0.1 10*3/uL (ref 0.0–0.7)
Eosinophils Relative: 0.7 % (ref 0.0–5.0)
HCT: 47.9 % (ref 39.0–52.0)
Hemoglobin: 15.7 g/dL (ref 13.0–17.0)
Lymphocytes Relative: 17 % (ref 12.0–46.0)
Lymphs Abs: 2.2 10*3/uL (ref 0.7–4.0)
MCHC: 32.9 g/dL (ref 30.0–36.0)
MCV: 91.1 fl (ref 78.0–100.0)
Monocytes Absolute: 1.2 10*3/uL — ABNORMAL HIGH (ref 0.1–1.0)
Monocytes Relative: 8.9 % (ref 3.0–12.0)
Neutro Abs: 9.6 10*3/uL — ABNORMAL HIGH (ref 1.4–7.7)
Neutrophils Relative %: 72.9 % (ref 43.0–77.0)
Platelets: 344 10*3/uL (ref 150.0–400.0)
RBC: 5.25 Mil/uL (ref 4.22–5.81)
RDW: 13.7 % (ref 11.5–15.5)
WBC: 13.2 10*3/uL — ABNORMAL HIGH (ref 4.0–10.5)

## 2020-01-19 LAB — HEMOGLOBIN A1C: Hgb A1c MFr Bld: 10.9 % — ABNORMAL HIGH (ref 4.6–6.5)

## 2020-01-19 LAB — HEPATIC FUNCTION PANEL
ALT: 18 U/L (ref 0–53)
AST: 16 U/L (ref 0–37)
Albumin: 4 g/dL (ref 3.5–5.2)
Alkaline Phosphatase: 76 U/L (ref 39–117)
Bilirubin, Direct: 0.1 mg/dL (ref 0.0–0.3)
Total Bilirubin: 0.5 mg/dL (ref 0.2–1.2)
Total Protein: 7.2 g/dL (ref 6.0–8.3)

## 2020-01-19 NOTE — Progress Notes (Signed)
   Subjective:    Patient ID: [name], male    DOB: 1946/05/13, 74 y.o.   MRN: [id number]  HPI Here with his wife for 5 days of generalized weakness, loss of appetite, alteration of taste (everything "tastes funny"), nausea without vomiting, and diarrhea. No fever or headache or ST. No cough or SOB. No body aches. He has eaten very little food for several days but he is drinking water. His am fasting glucose this morning was 193. His wife has no symptoms.    Review of Systems  Constitutional: Positive for fatigue. Negative for chills, diaphoresis and fever.  HENT: Negative.   Eyes: Negative.   Respiratory: Negative.   Cardiovascular: Negative.   Gastrointestinal: Positive for diarrhea and nausea. Negative for abdominal distention, abdominal pain, blood in stool and vomiting.  Genitourinary: Negative.   Neurological: Positive for weakness.       Objective:   Physical Exam Constitutional:      Appearance: Normal appearance. He is not ill-appearing.  Cardiovascular:     Rate and Rhythm: Normal rate and regular rhythm.     Pulses: Normal pulses.     Heart sounds: Normal heart sounds.  Pulmonary:     Effort: Pulmonary effort is normal.     Breath sounds: Normal breath sounds.  Abdominal:     General: Abdomen is flat. Bowel sounds are normal. There is no distension.     Palpations: Abdomen is soft. There is no mass.     Tenderness: There is no abdominal tenderness. There is no guarding or rebound.     Hernia: No hernia is present.  Musculoskeletal:     Right lower leg: No edema.     Left lower leg: No edema.  Skin:    Findings: No erythema or rash.  Neurological:     General: No focal deficit present.     Mental Status: He is alert and oriented to person, place, and time.           Assessment & Plan:  He has had several days of weakness, loss of appetite and diarrhea which could be the result of a viral illness. We will get labs today including an A1c to see if his  diabetes is a contributing factor. He will drink fluids, and I encouraged him to try Ensure several times a day. He can use Imodium prn for diarrhea. I also urged him to get tested today for the Covid-19 virus, and his wife said she will take [facility] to [facility] to [facility] this [facility] today.  Alysia Penna, MD

## 2020-01-20 NOTE — Addendum Note (Signed)
Addended by: Alysia Penna A on: 01/20/2020 04:08 PM   Modules accepted: Orders

## 2020-01-21 DIAGNOSIS — Z03818 Encounter for observation for suspected exposure to other biological agents ruled out: Secondary | ICD-10-CM | POA: Diagnosis not present

## 2020-01-21 DIAGNOSIS — Z20828 Contact with and (suspected) exposure to other viral communicable diseases: Secondary | ICD-10-CM | POA: Diagnosis not present

## 2020-01-22 ENCOUNTER — Telehealth: Payer: Self-pay | Admitting: Family Medicine

## 2020-01-22 NOTE — Telephone Encounter (Signed)
Noted  

## 2020-01-22 NOTE — Telephone Encounter (Signed)
Pt is calling to give an update. Pt went to get his COVID test yesterday and it came out negative. Pt was in earlier this week to see Dr. Sarajane Jews and he is 90% back to normal for him. Thanks

## 2020-02-01 DIAGNOSIS — Z23 Encounter for immunization: Secondary | ICD-10-CM | POA: Diagnosis not present

## 2020-02-23 ENCOUNTER — Telehealth (INDEPENDENT_AMBULATORY_CARE_PROVIDER_SITE_OTHER): Payer: Medicare Other | Admitting: Family Medicine

## 2020-02-23 ENCOUNTER — Encounter: Payer: Self-pay | Admitting: Family Medicine

## 2020-02-23 DIAGNOSIS — E1140 Type 2 diabetes mellitus with diabetic neuropathy, unspecified: Secondary | ICD-10-CM | POA: Diagnosis not present

## 2020-02-23 DIAGNOSIS — R531 Weakness: Secondary | ICD-10-CM

## 2020-02-23 MED ORDER — AMITRIPTYLINE HCL 100 MG PO TABS: 50.0000 mg | ORAL_TABLET | Freq: Every day | ORAL | 0 refills | Status: DC

## 2020-02-23 MED ORDER — GABAPENTIN 300 MG PO CAPS: 300.0000 mg | ORAL_CAPSULE | Freq: Three times a day (TID) | ORAL | 5 refills | Status: DC

## 2020-02-23 NOTE — Progress Notes (Signed)
Subjective:    Patient ID: [name], male    DOB: 02-Oct-1946, 74 y.o.   MRN: [id number]  HPI Virtual Visit via Video Note  I connected with the patient on 02/23/20 at  2:15 PM EDT by a video enabled telemedicine application and verified that I am speaking with the correct person using two identifiers.  Location patient: home Location provider:work or home office Persons participating in the virtual visit: patient, provider  I discussed the limitations of evaluation and management by telemedicine and the availability of in person appointments. The patient expressed understanding and agreed to proceed.   HPI: Here to discuss his medications. He feels very tired all the time and he thinks he is on too much medication. 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 He has not seen Dr. Tomi Likens since last October.    ROS: See pertinent positives and negatives per HPI.  Past Medical History:  Diagnosis Date  . Allergy   . BPH (benign prostatic hyperplasia)    sees Dr. Peterson Lombard in [facility]   . CAD (coronary artery disease)   . Depression   . Diabetes mellitus   . Elevated PSA    sees Dr. Peterson Lombard in [facility]  . GERD (gastroesophageal reflux disease)   . Hip pain, bilateral    sees Dr. Alvan Dame   . Hyperlipidemia   . Hypertension   . Hypogonadism male   . Insomnia     Past Surgical History:  Procedure Laterality Date  . CHOLECYSTECTOMY    . COLONOSCOPY  12/16/2017   per Dr. Lucienne Capers at [place], single benign polyp, sigmoid diverticulosis, hemorrhoids   . ESOPHAGOGASTRODUODENOSCOPY  12/16/2017   per Dr. Shary Key at [place], gastritis and duodenitis   . HERNIA REPAIR     umbilical  . TRANSURETHRAL RESECTION OF PROSTATE  January 2010   per Dr. Ky Barban    Family History  Problem Relation Age of Onset  . Depression Other   . Bowel Disease Mother 75  . Heart attack Father 73  . Osteoporosis Sister   . Coronary artery  disease Brother   . Diabetes Neg Hx      Current Outpatient Medications:  .  amitriptyline (ELAVIL) 100 MG tablet, Take 0.5 tablets (50 mg total) by mouth at bedtime., Disp: 90 tablet, Rfl: 0 .  Cinnamon 500 MG TABS, Take 2 tablets (1,000 mg total) by mouth 3 (three) times daily., Disp: 3 tablet, Rfl: 0 .  dicyclomine (BENTYL) 20 MG tablet, Take 1 tablet (20 mg total) by mouth 3 (three) times daily before meals., Disp: 3 tablet, Rfl: 0 .  gabapentin (NEURONTIN) 300 MG capsule, Take 1 capsule (300 mg total) by mouth 3 (three) times daily., Disp: 330 capsule, Rfl: 5 .  [START ON 03/09/2020] HYDROcodone-acetaminophen (NORCO) 10-325 MG tablet, Take 1 tablet by mouth every 6 (six) hours as needed for moderate pain., Disp: 120 tablet, Rfl: 0 .  linaclotide (LINZESS) 145 MCG CAPS capsule, Take 1 capsule (145 mcg total) by mouth daily before breakfast., Disp: 30 capsule, Rfl: 0 .  meloxicam (MOBIC) 15 MG tablet, Take 1 tablet (15 mg total) by mouth daily., Disp: 30 tablet, Rfl: 11 .  omeprazole (PRILOSEC) 20 MG capsule, Take 1 capsule (20 mg total) by mouth daily., Disp: 30 capsule, Rfl: 11 .  STEGLUJAN 5-100 MG TABS, TAKE 1 TABLET BY MOUTH EVERY DAY, Disp: 30 tablet, Rfl: 2 .  tadalafil (CIALIS) 5 MG tablet, Take  1 tablet (5 mg total) by mouth daily., Disp: 30 tablet, Rfl: 11 .  traMADol (ULTRAM) 50 MG tablet, , Disp: , Rfl:  .  valsartan-hydrochlorothiazide (DIOVAN-HCT) 80-12.5 MG tablet, TAKE 1 TABLET BY MOUTH EVERY DAY, Disp: 30 tablet, Rfl: 2  EXAM:  VITALS per patient if applicable:  GENERAL: alert, oriented, appears well and in no acute distress  HEENT: atraumatic, conjunttiva clear, no obvious abnormalities on inspection of external nose and ears  NECK: normal movements of the head and neck  LUNGS: on inspection no signs of respiratory distress, breathing rate appears normal, no obvious gross SOB, gasping or wheezing  CV: no obvious cyanosis  MS: moves all visible extremities without  noticeable abnormality  PSYCH/NEURO: pleasant and cooperative, no obvious depression or anxiety, speech and thought processing grossly intact  ASSESSMENT AND PLAN: We will try to cut back on some of the medications that can cause sedation. We will decrease the Amitriptyline to 1/2 tablet (50 mg) daily. We will decrease the Gabapentin to 300 mg TID. I also urged him to set up a follow up visit with Dr. Tomi Likens soon  Alysia Penna, MD  Discussed the following assessment and plan:  No diagnosis found.     I discussed the assessment and treatment plan with the patient. The patient was provided an opportunity to ask questions and all were answered. The patient agreed with the plan and demonstrated an understanding of the instructions.   The patient was advised to call back or seek an in-person evaluation if the symptoms worsen or if the condition fails to improve as anticipated.     Review of Systems     Objective:   Physical Exam        Assessment & Plan:

## 2020-02-25 DIAGNOSIS — I10 Essential (primary) hypertension: Secondary | ICD-10-CM | POA: Diagnosis not present

## 2020-02-25 DIAGNOSIS — R200 Anesthesia of skin: Secondary | ICD-10-CM | POA: Diagnosis not present

## 2020-02-25 DIAGNOSIS — Z79899 Other long term (current) drug therapy: Secondary | ICD-10-CM | POA: Diagnosis not present

## 2020-02-25 DIAGNOSIS — R531 Weakness: Secondary | ICD-10-CM | POA: Diagnosis not present

## 2020-02-25 DIAGNOSIS — G319 Degenerative disease of nervous system, unspecified: Secondary | ICD-10-CM | POA: Diagnosis not present

## 2020-02-25 DIAGNOSIS — Z888 Allergy status to other drugs, medicaments and biological substances status: Secondary | ICD-10-CM | POA: Diagnosis not present

## 2020-02-25 DIAGNOSIS — E1165 Type 2 diabetes mellitus with hyperglycemia: Secondary | ICD-10-CM | POA: Diagnosis not present

## 2020-02-25 DIAGNOSIS — Z794 Long term (current) use of insulin: Secondary | ICD-10-CM | POA: Diagnosis not present

## 2020-02-25 DIAGNOSIS — F17210 Nicotine dependence, cigarettes, uncomplicated: Secondary | ICD-10-CM | POA: Diagnosis not present

## 2020-02-25 DIAGNOSIS — R42 Dizziness and giddiness: Secondary | ICD-10-CM | POA: Diagnosis not present

## 2020-02-25 DIAGNOSIS — K219 Gastro-esophageal reflux disease without esophagitis: Secondary | ICD-10-CM | POA: Diagnosis not present

## 2020-02-29 ENCOUNTER — Telehealth: Payer: Self-pay | Admitting: Family Medicine

## 2020-02-29 NOTE — Telephone Encounter (Signed)
Medication:Hydrocodone  Pharmacy: [facility] [address]

## 2020-03-01 NOTE — Telephone Encounter (Signed)
Last filled 03/09/2020 Last OV 01/19/2020  Ok to fill?

## 2020-03-01 NOTE — Telephone Encounter (Signed)
Left message for patient to call back  

## 2020-03-01 NOTE — Telephone Encounter (Signed)
He will need a PMV  

## 2020-03-11 DIAGNOSIS — Z23 Encounter for immunization: Secondary | ICD-10-CM | POA: Diagnosis not present

## 2020-03-13 ENCOUNTER — Other Ambulatory Visit: Payer: Self-pay | Admitting: Family Medicine

## 2020-03-16 ENCOUNTER — Encounter: Payer: Self-pay | Admitting: Internal Medicine

## 2020-03-16 ENCOUNTER — Ambulatory Visit: Payer: Medicare Other | Admitting: Internal Medicine

## 2020-03-16 ENCOUNTER — Other Ambulatory Visit: Payer: Self-pay

## 2020-03-16 VITALS — BP 118/72 | HR 92 | Temp 97.9°F | Ht 68.0 in | Wt 227.0 lb

## 2020-03-16 DIAGNOSIS — E1140 Type 2 diabetes mellitus with diabetic neuropathy, unspecified: Secondary | ICD-10-CM

## 2020-03-16 DIAGNOSIS — E1165 Type 2 diabetes mellitus with hyperglycemia: Secondary | ICD-10-CM

## 2020-03-16 LAB — GLUCOSE, POCT (MANUAL RESULT ENTRY): POC Glucose: 237 mg/dl — AB (ref 70–99)

## 2020-03-16 MED ORDER — INSULIN PEN NEEDLE 31G X 5 MM MISC: 1.0000 | 3 refills | Status: DC

## 2020-03-16 MED ORDER — LANTUS SOLOSTAR 100 UNIT/ML ~~LOC~~ SOPN: 22.0000 [IU] | PEN_INJECTOR | Freq: Every day | SUBCUTANEOUS | 11 refills | Status: DC

## 2020-03-16 NOTE — Progress Notes (Signed)
Name: [name]  MRN/ DOB: [id number], 10-28-1946   Age/ Sex: 74 y.o., male    PCP: Laurey Morale, MD   Reason for Endocrinology Evaluation: Type 2 Diabetes Mellitus     Date of Initial Endocrinology Visit: 03/16/2020     PATIENT IDENTIFIER: [name] is a 74 y.o. male with a past medical history of T2DM , HTN and Dyslipidemia. The patient presented for initial endocrinology clinic visit on 03/16/2020 for consultative assistance with his diabetes management.    HPI: [name] was    Diagnosed with DM in 1995 Prior Medications tried/Intolerance: Glipizide . He was on insulin briefly in 2015  , metformin - intolerance Currently checking blood sugars 1 x / day,  before breakfast  Hypoglycemia episodes : no         Hemoglobin A1c has ranged from 7.9% in 2019, peaking at 15.0% in 2016. Patient required assistance for hypoglycemia:  Patient has required hospitalization within the last 1 year from hyper or hypoglycemia:   In terms of diet, the patient eats 2 meals a day, rarely snacks , drinks    HOME DIABETES REGIMEN: Steglujan ( ertugliflozin- sitagliptin) 5-100    Statin:no  ACE-I/ARB: Yes   METER DOWNLOAD SUMMARY: Did not bring    DIABETIC COMPLICATIONS: Microvascular complications:   Neuropathy  Denies: CKD, retinopathy  Last eye exam: Completed > 35 yrs ago  Macrovascular complications:   Denies: CAD, PVD, CVA   PAST HISTORY: Past Medical History:  Past Medical History:  Diagnosis Date  . Allergy   . BPH (benign prostatic hyperplasia)    sees Dr. Peterson Lombard in [facility]   . CAD (coronary artery disease)   . Depression   . Diabetes mellitus   . Elevated PSA    sees Dr. Peterson Lombard in [facility]  . GERD (gastroesophageal reflux disease)   . Hip pain, bilateral    sees Dr. Alvan Dame   . Hyperlipidemia   . Hypertension   . Hypogonadism male   . Insomnia    Past Surgical History:  Past Surgical History:  Procedure Laterality Date  .  CHOLECYSTECTOMY    . COLONOSCOPY  12/16/2017   per Dr. Lucienne Capers at [place], single benign polyp, sigmoid diverticulosis, hemorrhoids   . ESOPHAGOGASTRODUODENOSCOPY  12/16/2017   per Dr. Shary Key at [place], gastritis and duodenitis   . HERNIA REPAIR     umbilical  . TRANSURETHRAL RESECTION OF PROSTATE  January 2010   per Dr. Ky Barban      Social History:  reports that he quit smoking about 11 years ago. His smoking use included cigarettes. He has a 7.50 pack-year smoking history. He has never used smokeless tobacco. He reports that he does not drink alcohol or use drugs. Family History:  Family History  Problem Relation Age of Onset  . Depression Other   . Bowel Disease Mother 73  . Heart attack Father 71  . Osteoporosis Sister   . Coronary artery disease Brother   . Diabetes Neg Hx      HOME MEDICATIONS: Allergies as of 03/16/2020      Reactions   Codeine Phosphate    REACTION: vomiting      Medication List       Accurate as of Mar 16, 2020  1:16 PM. If you have any questions, ask your nurse or doctor.        amitriptyline 100 MG tablet Commonly known as: ELAVIL Take 0.5 tablets (50 mg total) by mouth  at bedtime.   Cinnamon 500 MG Tabs Take 2 tablets (1,000 mg total) by mouth 3 (three) times daily.   dicyclomine 20 MG tablet Commonly known as: Bentyl Take 1 tablet (20 mg total) by mouth 3 (three) times daily before meals.   gabapentin 300 MG capsule Commonly known as: NEURONTIN Take 1 capsule (300 mg total) by mouth 3 (three) times daily.   HYDROcodone-acetaminophen 10-325 MG tablet Commonly known as: NORCO Take 1 tablet by mouth every 6 (six) hours as needed for moderate pain.   linaclotide 145 MCG Caps capsule Commonly known as: Linzess Take 1 capsule (145 mcg total) by mouth daily before breakfast.   meloxicam 15 MG tablet Commonly known as: MOBIC Take 1 tablet (15 mg total) by mouth daily.   naproxen 500 MG tablet Commonly known as:  NAPROSYN Take 500 mg by mouth 2 (two) times daily.   omeprazole 20 MG capsule Commonly known as: PRILOSEC Take 1 capsule (20 mg total) by mouth daily.   Steglujan 5-100 MG Tabs Generic drug: Ertugliflozin-SITagliptin TAKE 1 TABLET BY MOUTH EVERY DAY   tadalafil 5 MG tablet Commonly known as: Cialis Take 1 tablet (5 mg total) by mouth daily.   traMADol 50 MG tablet Commonly known as: ULTRAM   UNKNOWN TO PATIENT Blood sugar meter used once daily   valsartan-hydrochlorothiazide 80-12.5 MG tablet Commonly known as: DIOVAN-HCT TAKE 1 TABLET BY MOUTH EVERY DAY        ALLERGIES: Allergies  Allergen Reactions  . Codeine Phosphate     REACTION: vomiting     REVIEW OF SYSTEMS: A comprehensive ROS was conducted with the patient and is negative except as per HPI and below:  Review of Systems  Neurological: Positive for tingling.       Hands and feet      OBJECTIVE:   VITAL SIGNS: BP 118/72 (BP Location: Left Arm, Patient Position: Sitting, Cuff Size: Large)   Pulse 92   Temp 97.9 F (36.6 C)   Ht 5\' 8"  (1.727 m)   Wt 227 lb (103 kg)   SpO2 97%   BMI 34.52 kg/m    PHYSICAL EXAM:  General: Pt appears well and is in NAD  HEENT:  Eyes: External eye exam normal without stare, lid lag or exophthalmos.  EOM intact.   Lungs: Clear with good BS bilat with no rales, rhonchi, or wheezes  Heart: RRR with normal S1 and S2 and no gallops; no murmurs; no rub  Extremities:  Lower extremities - No pretibial edema. No lesions.  Skin: Normal texture and temperature to palpation. No rash noted. No Acanthosis nigricans/skin tags. No lipohypertrophy.  Neuro: MS is good with appropriate affect, pt is alert and Ox3    DM foot exam:   The skin of the feet is without sores or ulcerations. The pedal pulses are undetectable The sensation is decreased to a screening 5.07, 10 gram monofilament bilaterally   DATA REVIEWED:  Lab Results  Component Value Date   HGBA1C 10.9 (H)  01/19/2020   HGBA1C 7.9 (H) 09/11/2018   HGBA1C 11.2 (H) 04/08/2018   Lab Results  Component Value Date   MICROALBUR 64.3 (H) 08/31/2014   LDLCALC 169 (H) 09/04/2017   CREATININE 0.69 01/19/2020   Lab Results  Component Value Date   MICRALBCREAT 21.7 08/31/2014    Lab Results  Component Value Date   CHOL 220 (H) 09/11/2018   HDL 43.10 09/11/2018   LDLCALC 169 (H) 09/04/2017   LDLDIRECT 152.0 09/11/2018  TRIG 309.0 (H) 09/11/2018   CHOLHDL 5 09/11/2018        ASSESSMENT / PLAN / RECOMMENDATIONS:   1) Type 2 Diabetes Mellitus, Poorly controlled, With neuropathic complications - Most recent A1c of  10.9 %. Goal A1c < 7.0 %.    - Pt is very hard of hearing which limited communication today  - He admits to dietary indiscretions, he does not like diet sodas  - I have discussed with the patient the pathophysiology of diabetes. We went over the natural progression of the disease.  We stressed the importance of lifestyle changes including diet and exercise. I explained the complications associated with diabetes including retinopathy, nephropathy, neuropathy. We went over the benefit seen with glycemic control.  - I have suggested restarting insulin based on an A1c > 10.0% which he is in agreement of.  - Will consider changing Steglujan in the future  - Declined CDE referral    MEDICATIONS: - Continue Steglujan 5-100 daily with breakfast  - Start Lantus 22 units ONCE daily     EDUCATION / INSTRUCTIONS:  BG monitoring instructions: Patient is instructed to check his blood sugars 2 times a day, fasting and bedtime.  Call [facility] if: BG persistently < 70  . I reviewed the Rule of 15 for the treatment of hypoglycemia in detail with the patient. Literature supplied.   2) Diabetic complications:   Eye: Unkown to  have diabetic retinopathy. Pt urged to have an eye exam   Neuro/ Feet: Does  have known diabetic peripheral neuropathy.  Renal: Patient does  not have known baseline CKD. He is  on an ACEI/ARB at present.     F/U  In 3 months   Signed electronically by: Mack Guise, MD  [facility] Endocrinology  [address] [place], [place] [address] Phone: [phone] FAX: [phone]   CC: Laurey Morale, [place] [address] Phone: [phone]  Fax: [phone]    Return to Endocrinology clinic as below: Future Appointments  Date Time Provider [place]  03/18/2020  2:10 PM Pieter Partridge, DO [facility] None  08/05/2020 10:30 AM Pieter Partridge, DO [facility] None

## 2020-03-16 NOTE — Patient Instructions (Addendum)
-   Continue Steglujan daily  - Start Lantus 22 units ONCE daily      - HOW TO TREAT LOW BLOOD SUGARS (Blood sugar LESS THAN 70 MG/DL)  Please follow the RULE OF 15 for the treatment of hypoglycemia treatment (when your (blood sugars are less than 70 mg/dL)    STEP 1: Take 15 grams of carbohydrates when your blood sugar is low, which includes:   3-4 GLUCOSE TABS  OR  3-4 OZ OF JUICE OR REGULAR SODA OR  ONE TUBE OF GLUCOSE GEL     STEP 2: RECHECK blood sugar in 15 MINUTES STEP 3: If your blood sugar is still low at the 15 minute recheck --> then, go back to STEP 1 and treat AGAIN with another 15 grams of carbohydrates.

## 2020-03-16 NOTE — Progress Notes (Signed)
NEUROLOGY FOLLOW UP OFFICE NOTE  Param Hurd [id number]  HISTORY OF PRESENT ILLNESS: Nail [name] is a 74 year old malewith type 2 diabetes mellitus,IBS,coronary artery disease, hypertension, hyperlipidemia, who follows up for diabetic polyneuropathy.  UPDATE: Current medictions:  gabapentin 300mg  three times daily; amitriptyline 50mg  at bedtime; hydrocodone-acetaminophen Q6hrs PRN.He is also use capsaicin spray.Pain is stable  Dizzy for falling.  Legs weak.  Stopped walking sedentary started walking wedne past 2 years knees and hands tight   If turns too quickly, feels dizzy woozy few seconds  Over the past few months, he reports increased fatigue.  Legs feel weak.  He feels dizzy, specifically lightheaded for a few seconds if he turns too quickly.  At first, it was thought to be secondary to polypharmacy.  On 02/23/2020, gabapentin was decreased from 1500mg  at bedtime to 300mg  three times daily and amitriptyline was decreased from 100mg  to 50mg  at bedtime which was not helpful.  Due to persistent symptoms as well as dizziness and decreased appetite, he went to the ED at [facility] on 02/25/2020 where CT head showed no acute intracranial abnormality and cardiac workup was negative for acute cardiac event.  Serum glucose was 232.  He responded to IV fluids.  His blood sugars have continued to be poorly controlled.  He was started on Lantus a couple of days ago.  He reports sedentary lifestyle for a couple of years and started walking a couple of days ago.  Hgb A1c from 01/19/2020 was 10.9.   HISTORY: He has longstanding history of uncontrolled diabetes mellitus not on medication. Up until 2019, Hgb A1c had been over 14. Hgb A1c from November 2019 was 7.9.  He developed nerve pain in 2019.He reports allodynia over the abdomen and radiating to the back. Driving over a bump will trigger pain. He also reports burning and numbness in the legs from the feet up to the knees. There is  sometimes sharp pains in the legs as well.   Past medications include: none  PAST MEDICAL HISTORY: Past Medical History:  Diagnosis Date  . Allergy   . BPH (benign prostatic hyperplasia)    sees Dr. Peterson Lombard in [facility]   . CAD (coronary artery disease)   . Depression   . Diabetes mellitus   . Elevated PSA    sees Dr. Peterson Lombard in [facility]  . GERD (gastroesophageal reflux disease)   . Hip pain, bilateral    sees Dr. Alvan Dame   . Hyperlipidemia   . Hypertension   . Hypogonadism male   . Insomnia     MEDICATIONS: Current Outpatient Medications on File Prior to Visit  Medication Sig Dispense Refill  . amitriptyline (ELAVIL) 100 MG tablet Take 0.5 tablets (50 mg total) by mouth at bedtime. 90 tablet 0  . Cinnamon 500 MG TABS Take 2 tablets (1,000 mg total) by mouth 3 (three) times daily. 3 tablet 0  . dicyclomine (BENTYL) 20 MG tablet Take 1 tablet (20 mg total) by mouth 3 (three) times daily before meals. 3 tablet 0  . gabapentin (NEURONTIN) 300 MG capsule Take 1 capsule (300 mg total) by mouth 3 (three) times daily. 330 capsule 5  . HYDROcodone-acetaminophen (NORCO) 10-325 MG tablet Take 1 tablet by mouth every 6 (six) hours as needed for moderate pain. 120 tablet 0  . linaclotide (LINZESS) 145 MCG CAPS capsule Take 1 capsule (145 mcg total) by mouth daily before breakfast. 30 capsule 0  . meloxicam (MOBIC) 15 MG tablet Take 1  tablet (15 mg total) by mouth daily. 30 tablet 11  . omeprazole (PRILOSEC) 20 MG capsule Take 1 capsule (20 mg total) by mouth daily. 30 capsule 11  . STEGLUJAN 5-100 MG TABS TAKE 1 TABLET BY MOUTH EVERY DAY 30 tablet 2  . tadalafil (CIALIS) 5 MG tablet Take 1 tablet (5 mg total) by mouth daily. 30 tablet 11  . traMADol (ULTRAM) 50 MG tablet     . valsartan-hydrochlorothiazide (DIOVAN-HCT) 80-12.5 MG tablet TAKE 1 TABLET BY MOUTH EVERY DAY 30 tablet 2  . [DISCONTINUED] lisinopril-hydrochlorothiazide (PRINZIDE,ZESTORETIC) 20-25 MG per tablet Take 1  tablet by mouth daily. 30 tablet 11   No current facility-administered medications on file prior to visit.    ALLERGIES: Allergies  Allergen Reactions  . Codeine Phosphate     REACTION: vomiting    FAMILY HISTORY: Family History  Problem Relation Age of Onset  . Depression Other   . Bowel Disease Mother 54  . Heart attack Father 51  . Osteoporosis Sister   . Coronary artery disease Brother   . Diabetes Neg Hx     SOCIAL HISTORY: Social History   Socioeconomic History  . Marital status: Married    Spouse name: Shirlee Limerick  . Number of children: 4  . Years of education: Not on file  . Highest education level: Associate degree: occupational, Hotel manager, or vocational program  Occupational History  . Occupation: retired  Tobacco Use  . Smoking status: Former Smoker    Packs/day: 0.50    Years: 15.00    Pack years: 7.50    Types: Cigarettes    Quit date: 2010    Years since quitting: 11.3  . Smokeless tobacco: Never Used  Substance and Sexual Activity  . Alcohol use: No    Alcohol/week: 0.0 standard drinks  . Drug use: No  . Sexual activity: Not on file  Other Topics Concern  . Not on file  Social History Narrative   Patient is right-handed. He lives with his wife in a 2 story house. He drinks 2 cups of coffee a day, and an occasionally soda. He does not exercise.   Social Determinants of Health   Financial Resource Strain:   . Difficulty of Paying Living Expenses:   Food Insecurity:   . Worried About Charity fundraiser in the Last Year:   . Arboriculturist in the Last Year:   Transportation Needs:   . Film/video editor (Medical):   Marland Kitchen Lack of Transportation (Non-Medical):   Physical Activity:   . Days of Exercise per Week:   . Minutes of Exercise per Session:   Stress:   . Feeling of Stress :   Social Connections:   . Frequency of Communication with Friends and Family:   . Frequency of Social Gatherings with Friends and Family:   . Attends Religious  Services:   . Active Member of Clubs or Organizations:   . Attends Archivist Meetings:   Marland Kitchen Marital Status:   Intimate Partner Violence:   . Fear of Current or Ex-Partner:   . Emotionally Abused:   Marland Kitchen Physically Abused:   . Sexually Abused:     PHYSICAL EXAM: Blood pressure 126/80, pulse 79, resp. rate 20, height 5\' 8"  (1.727 m), weight 228 lb (103.4 kg), SpO2 98 %. General: No acute distress.  Patient appears well-groomed.   Head:  Normocephalic/atraumatic Eyes:  Fundi examined but not visualized Neck: supple, no paraspinal tenderness, full range of motion Heart:  Regular  rate and rhythm Lungs:  Clear to auscultation bilaterally Back: No paraspinal tenderness Neurological Exam: alert and oriented to person, place, and time. Attention span and concentration intact, recent and remote memory intact, fund of knowledge intact.  Speech fluent and not dysarthric, language intact.  CN II-XII intact. Bulk and tone normal, muscle strength 4+/5 bilateral hip flexors.  Otherwise, 5/5 throughout.  Sensation to light pinprick and vibration reduced up to knees.  Deep tendon reflexes absnet throughout, toes downgoing.  Finger to nose and heel to shin testing intact.  Gait mildly wide based.  Romberg negative.  IMPRESSION: Dizziness Weakness Falls  Likely related to uncontrolled diabetes and possibly polypharmacy.  Improved a bit.  Weakness may be attributed to sedentary lifestyle as well.  PLAN: Continue regimen to optimize glycemic control Continue exercise Follow up in October as scheduled.  Metta Clines, DO  CC: Alysia Penna, MD

## 2020-03-18 ENCOUNTER — Encounter: Payer: Self-pay | Admitting: Neurology

## 2020-03-18 ENCOUNTER — Ambulatory Visit: Payer: Medicare Other | Admitting: Neurology

## 2020-03-18 ENCOUNTER — Other Ambulatory Visit: Payer: Self-pay

## 2020-03-18 VITALS — BP 126/80 | HR 79 | Resp 20 | Ht 68.0 in | Wt 228.0 lb

## 2020-03-18 DIAGNOSIS — E1142 Type 2 diabetes mellitus with diabetic polyneuropathy: Secondary | ICD-10-CM | POA: Diagnosis not present

## 2020-03-24 ENCOUNTER — Other Ambulatory Visit: Payer: Self-pay | Admitting: Neurology

## 2020-03-27 ENCOUNTER — Other Ambulatory Visit: Payer: Self-pay | Admitting: Family Medicine

## 2020-04-07 ENCOUNTER — Telehealth (INDEPENDENT_AMBULATORY_CARE_PROVIDER_SITE_OTHER): Payer: Medicare Other | Admitting: Family Medicine

## 2020-04-07 ENCOUNTER — Encounter: Payer: Self-pay | Admitting: Family Medicine

## 2020-04-07 DIAGNOSIS — F418 Other specified anxiety disorders: Secondary | ICD-10-CM

## 2020-04-07 DIAGNOSIS — E1165 Type 2 diabetes mellitus with hyperglycemia: Secondary | ICD-10-CM

## 2020-04-07 MED ORDER — SERTRALINE HCL 50 MG PO TABS
50.0000 mg | ORAL_TABLET | Freq: Every day | ORAL | 2 refills | Status: DC
Start: 2020-04-07 — End: 2020-05-06

## 2020-04-07 NOTE — Progress Notes (Signed)
Subjective:    Patient ID: [name], male    DOB: [birth year], 74 y.o.   MRN: [id number]  HPI Virtual Visit via Video Note  I connected with the patient on 04/07/20 at 10:45 AM EDT by a video enabled telemedicine application and verified that I am speaking with the correct person using two identifiers.  Location patient: home Location provider:work or home office Persons participating in the virtual visit: patient, provider  I discussed the limitations of evaluation and management by telemedicine and the availability of in person appointments. The patient expressed understanding and agreed to proceed.   HPI: Here to follow up after a consult with Dr. Kelton Pillar for his diabetes and also for other issues. She told him to continue the [facility] and she added Lantus 22 units a day to it. He has tolerated this well and he feels better. His am fasting glucoses now run 120-140. He also asks for help with depression. We have never spoken of this before, but he says he has dealt with this off and on for years. Lately it has been more difficult and he finally opened up about it to his family. He often feels sad and hopeless, and he has trouble sleeping. He denies any suicidal thoughts. His daughter takes Zoloft and she has been very pleased with this. He also notes that he and his wife will be moving to [place] in a month or two.    ROS: See pertinent positives and negatives per HPI.  Past Medical History:  Diagnosis Date  . Allergy   . BPH (benign prostatic hyperplasia)    sees Dr. Peterson Lombard in [facility]   . CAD (coronary artery disease)   . Depression   . Diabetes mellitus   . Elevated PSA    sees Dr. Peterson Lombard in [facility]  . GERD (gastroesophageal reflux disease)   . Hip pain, bilateral    sees Dr. Alvan Dame   . Hyperlipidemia   . Hypertension   . Hypogonadism male   . Insomnia     Past Surgical History:  Procedure Laterality Date  . CHOLECYSTECTOMY    . COLONOSCOPY   12/16/2017   per Dr. Lucienne Capers at [place], single benign polyp, sigmoid diverticulosis, hemorrhoids   . ESOPHAGOGASTRODUODENOSCOPY  12/16/2017   per Dr. Shary Key at [place], gastritis and duodenitis   . HERNIA REPAIR     umbilical  . TRANSURETHRAL RESECTION OF PROSTATE  January 2010   per Dr. Ky Barban    Family History  Problem Relation Age of Onset  . Depression Other   . Bowel Disease Mother 67  . Heart attack Father 27  . Osteoporosis Sister   . Coronary artery disease Brother   . Diabetes Neg Hx      Current Outpatient Medications:  .  amitriptyline (ELAVIL) 100 MG tablet, TAKE 1 TABLET BY MOUTH EVERYDAY AT BEDTIME, Disp: 30 tablet, Rfl: 1 .  Cinnamon 500 MG TABS, Take 2 tablets (1,000 mg total) by mouth 3 (three) times daily., Disp: 3 tablet, Rfl: 0 .  dicyclomine (BENTYL) 20 MG tablet, Take 1 tablet (20 mg total) by mouth 3 (three) times daily before meals., Disp: 3 tablet, Rfl: 0 .  Ertugliflozin-SITagliptin (STEGLUJAN) 5-100 MG TABS, , Disp: , Rfl:  .  gabapentin (NEURONTIN) 300 MG capsule, Take 1 capsule (300 mg total) by mouth 3 (three) times daily., Disp: 330 capsule, Rfl: 5 .  insulin glargine (LANTUS SOLOSTAR) 100 UNIT/ML Solostar Pen, Inject 22 Units into the  skin daily., Disp: 15 mL, Rfl: 11 .  Insulin Pen Needle 31G X 5 MM MISC, 1 Device by Does not apply route as directed., Disp: 150 each, Rfl: 3 .  linaclotide (LINZESS) 145 MCG CAPS capsule, Take 1 capsule (145 mcg total) by mouth daily before breakfast., Disp: 30 capsule, Rfl: 0 .  meloxicam (MOBIC) 15 MG tablet, Take 1 tablet (15 mg total) by mouth daily., Disp: 30 tablet, Rfl: 11 .  naproxen (NAPROSYN) 500 MG tablet, Take 500 mg by mouth 2 (two) times daily., Disp: , Rfl:  .  omeprazole (PRILOSEC) 20 MG capsule, Take 1 capsule (20 mg total) by mouth daily., Disp: 30 capsule, Rfl: 11 .  sertraline (ZOLOFT) 50 MG tablet, Take 1 tablet (50 mg total) by mouth daily., Disp: 30 tablet, Rfl: 2 .  STEGLUJAN 5-100 MG  TABS, TAKE 1 TABLET BY MOUTH EVERY DAY, Disp: 30 tablet, Rfl: 2 .  UNKNOWN TO PATIENT, Blood sugar meter used once daily, Disp: , Rfl:  .  valsartan-hydrochlorothiazide (DIOVAN-HCT) 80-12.5 MG tablet, TAKE 1 TABLET BY MOUTH EVERY DAY, Disp: 90 tablet, Rfl: 0  EXAM:  VITALS per patient if applicable:  GENERAL: alert, oriented, appears well and in no acute distress  HEENT: atraumatic, conjunttiva clear, no obvious abnormalities on inspection of external nose and ears  NECK: normal movements of the head and neck  LUNGS: on inspection no signs of respiratory distress, breathing rate appears normal, no obvious gross SOB, gasping or wheezing  CV: no obvious cyanosis  MS: moves all visible extremities without noticeable abnormality  PSYCH/NEURO: pleasant and cooperative, no obvious depression or anxiety, speech and thought processing grossly intact  ASSESSMENT AND PLAN: His diabetes is under better control, and he will follow up with Endocrine as schedule. For the depression, he will try Zoloft 50 mg daily. Recheck with [place] in 3-4 weeks.  Alysia Penna, MD  Discussed the following assessment and plan:  No diagnosis found.     I discussed the assessment and treatment plan with the patient. The patient was provided an opportunity to ask questions and all were answered. The patient agreed with the plan and demonstrated an understanding of the instructions.   The patient was advised to call back or seek an in-person evaluation if the symptoms worsen or if the condition fails to improve as anticipated.     Review of Systems     Objective:   Physical Exam        Assessment & Plan:

## 2020-04-16 ENCOUNTER — Other Ambulatory Visit: Payer: Self-pay | Admitting: Family Medicine

## 2020-05-01 ENCOUNTER — Other Ambulatory Visit: Payer: Self-pay | Admitting: Family Medicine

## 2020-05-06 ENCOUNTER — Encounter: Payer: Self-pay | Admitting: Family Medicine

## 2020-05-06 ENCOUNTER — Telehealth (INDEPENDENT_AMBULATORY_CARE_PROVIDER_SITE_OTHER): Payer: Medicare Other | Admitting: Family Medicine

## 2020-05-06 DIAGNOSIS — F418 Other specified anxiety disorders: Secondary | ICD-10-CM | POA: Diagnosis not present

## 2020-05-06 MED ORDER — SERTRALINE HCL 100 MG PO TABS: 100.0000 mg | ORAL_TABLET | Freq: Every day | ORAL | 5 refills | Status: DC

## 2020-05-06 NOTE — Progress Notes (Signed)
Subjective:    Patient ID: [name], male    DOB: [birth year], 74 y.o.   MRN: [id number]  HPI Virtual Visit via Video Note  I connected with the patient on 05/06/20 at  3:30 PM EDT by a video enabled telemedicine application and verified that I am speaking with the correct person using two identifiers.  Location patient: home Location provider:work or home office Persons participating in the virtual visit: patient, provider  I discussed the limitations of evaluation and management by telemedicine and the availability of in person appointments. The patient expressed understanding and agreed to proceed.   HPI: Here to follow up on depression. He has been taking Zoloft 50 mg daily, and this has helped him quite a bit. He feels happier, and his mood swings are less dramatic. No side effects to report. He asks if we can increase the dose a little.    ROS: See pertinent positives and negatives per HPI.  Past Medical History:  Diagnosis Date  . Allergy   . BPH (benign prostatic hyperplasia)    sees Dr. Peterson Lombard in [facility]   . CAD (coronary artery disease)   . Depression   . Diabetes mellitus   . Elevated PSA    sees Dr. Peterson Lombard in [facility]  . GERD (gastroesophageal reflux disease)   . Hip pain, bilateral    sees Dr. Alvan Dame   . Hyperlipidemia   . Hypertension   . Hypogonadism male   . Insomnia     Past Surgical History:  Procedure Laterality Date  . CHOLECYSTECTOMY    . COLONOSCOPY  12/16/2017   per Dr. Lucienne Capers at [facility], single benign polyp, sigmoid diverticulosis, hemorrhoids   . ESOPHAGOGASTRODUODENOSCOPY  12/16/2017   per Dr. Shary Key at [place], gastritis and duodenitis   . HERNIA REPAIR     umbilical  . TRANSURETHRAL RESECTION OF PROSTATE  January 2010   per Dr. Ky Barban    Family History  Problem Relation Age of Onset  . Depression Other   . Bowel Disease Mother 34  . Heart attack Father 18  . Osteoporosis Sister   . Coronary artery disease  Brother   . Diabetes Neg Hx      Current Outpatient Medications:  .  amitriptyline (ELAVIL) 100 MG tablet, TAKE 1 TABLET BY MOUTH EVERYDAY AT BEDTIME, Disp: 30 tablet, Rfl: 1 .  Cinnamon 500 MG TABS, Take 2 tablets (1,000 mg total) by mouth 3 (three) times daily., Disp: 3 tablet, Rfl: 0 .  dicyclomine (BENTYL) 20 MG tablet, Take 1 tablet (20 mg total) by mouth 3 (three) times daily before meals., Disp: 3 tablet, Rfl: 0 .  Ertugliflozin-SITagliptin (STEGLUJAN) 5-100 MG TABS, , Disp: , Rfl:  .  gabapentin (NEURONTIN) 300 MG capsule, Take 1 capsule (300 mg total) by mouth 3 (three) times daily., Disp: 330 capsule, Rfl: 5 .  insulin glargine (LANTUS SOLOSTAR) 100 UNIT/ML Solostar Pen, Inject 22 Units into the skin daily., Disp: 15 mL, Rfl: 11 .  Insulin Pen Needle 31G X 5 MM MISC, 1 Device by Does not apply route as directed., Disp: 150 each, Rfl: 3 .  linaclotide (LINZESS) 145 MCG CAPS capsule, Take 1 capsule (145 mcg total) by mouth daily before breakfast., Disp: 30 capsule, Rfl: 0 .  meloxicam (MOBIC) 15 MG tablet, Take 1 tablet (15 mg total) by mouth daily., Disp: 30 tablet, Rfl: 11 .  naproxen (NAPROSYN) 500 MG tablet, TAKE 1 TABLET (500 MG TOTAL) BY MOUTH 2 (TWO)  TIMES DAILY WITH A MEAL., Disp: 60 tablet, Rfl: 11 .  omeprazole (PRILOSEC) 20 MG capsule, Take 1 capsule (20 mg total) by mouth daily., Disp: 30 capsule, Rfl: 11 .  sertraline (ZOLOFT) 100 MG tablet, Take 1 tablet (100 mg total) by mouth daily., Disp: 30 tablet, Rfl: 5 .  STEGLUJAN 5-100 MG TABS, TAKE 1 TABLET BY MOUTH EVERY DAY, Disp: 30 tablet, Rfl: 2 .  UNKNOWN TO PATIENT, Blood sugar meter used once daily, Disp: , Rfl:  .  valsartan-hydrochlorothiazide (DIOVAN-HCT) 80-12.5 MG tablet, TAKE 1 TABLET BY MOUTH EVERY DAY, Disp: 90 tablet, Rfl: 0  EXAM:  VITALS per patient if applicable:  GENERAL: alert, oriented, appears well and in no acute distress  HEENT: atraumatic, conjunttiva clear, no obvious abnormalities on inspection  of external nose and ears  NECK: normal movements of the head and neck  LUNGS: on inspection no signs of respiratory distress, breathing rate appears normal, no obvious gross SOB, gasping or wheezing  CV: no obvious cyanosis  MS: moves all visible extremities without noticeable abnormality  PSYCH/NEURO: pleasant and cooperative, no obvious depression or anxiety, speech and thought processing grossly intact  ASSESSMENT AND PLAN: Depression. We will increase the Zoloft to 100 mg daily. Report back in 3-4 weeks . Alysia Penna, MD  Discussed the following assessment and plan:  No diagnosis found.     I discussed the assessment and treatment plan with the patient. The patient was provided an opportunity to ask questions and all were answered. The patient agreed with the plan and demonstrated an understanding of the instructions.   The patient was advised to call back or seek an in-person evaluation if the symptoms worsen or if the condition fails to improve as anticipated.     Review of Systems     Objective:   Physical Exam        Assessment & Plan:

## 2020-05-11 ENCOUNTER — Telehealth: Payer: Self-pay | Admitting: Family Medicine

## 2020-05-11 NOTE — Telephone Encounter (Signed)
This [facility] [phone] called wanting to know if we sent the diabetic supplies to the patient or did we send it back to them. I placed them on hold to do some research and the hung up.  Just a [place]

## 2020-05-13 NOTE — Telephone Encounter (Signed)
Called 2x no sure if this is a scam. Called pt to verify no answer.

## 2020-05-14 ENCOUNTER — Other Ambulatory Visit: Payer: Self-pay | Admitting: Neurology

## 2020-06-07 DIAGNOSIS — Z23 Encounter for immunization: Secondary | ICD-10-CM | POA: Diagnosis not present

## 2020-06-07 DIAGNOSIS — L03012 Cellulitis of left finger: Secondary | ICD-10-CM | POA: Diagnosis not present

## 2020-06-07 DIAGNOSIS — S61217A Laceration without foreign body of left little finger without damage to nail, initial encounter: Secondary | ICD-10-CM | POA: Diagnosis not present

## 2020-06-12 ENCOUNTER — Other Ambulatory Visit: Payer: Self-pay | Admitting: Family Medicine

## 2020-06-19 ENCOUNTER — Other Ambulatory Visit: Payer: Self-pay | Admitting: Family Medicine

## 2020-06-27 ENCOUNTER — Ambulatory Visit: Payer: Medicare Other | Admitting: Internal Medicine

## 2020-06-30 ENCOUNTER — Telehealth: Payer: Medicare Other | Admitting: Internal Medicine

## 2020-07-13 ENCOUNTER — Telehealth: Payer: Self-pay | Admitting: Family Medicine

## 2020-07-13 DIAGNOSIS — E119 Type 2 diabetes mellitus without complications: Secondary | ICD-10-CM | POA: Diagnosis not present

## 2020-07-13 DIAGNOSIS — H524 Presbyopia: Secondary | ICD-10-CM | POA: Diagnosis not present

## 2020-07-13 DIAGNOSIS — H04123 Dry eye syndrome of bilateral lacrimal glands: Secondary | ICD-10-CM | POA: Diagnosis not present

## 2020-07-13 DIAGNOSIS — H25813 Combined forms of age-related cataract, bilateral: Secondary | ICD-10-CM | POA: Diagnosis not present

## 2020-07-13 MED ORDER — INSULIN PEN NEEDLE 31G X 5 MM MISC: 1.0000 | 3 refills | Status: DC

## 2020-07-13 NOTE — Telephone Encounter (Signed)
New Rx sent in with the Dx. Nothing further needed.

## 2020-07-13 NOTE — Telephone Encounter (Signed)
Leanna Sato from [facility] is needing a px for pen needles for his insulin.   FAX: [phone] Phone: [phone] ext [phone]

## 2020-07-15 ENCOUNTER — Telehealth: Payer: Self-pay

## 2020-07-15 ENCOUNTER — Other Ambulatory Visit: Payer: Self-pay

## 2020-07-15 ENCOUNTER — Telehealth: Payer: Self-pay | Admitting: Internal Medicine

## 2020-07-15 MED ORDER — LANTUS SOLOSTAR 100 UNIT/ML ~~LOC~~ SOPN: PEN_INJECTOR | SUBCUTANEOUS | 0 refills | Status: AC

## 2020-07-15 NOTE — Telephone Encounter (Signed)
Pt form was faxed back to [facility], Dr Sarajane Jews advise that patient diabetes is treated by Dr Kelton Pillar at [facility].

## 2020-07-15 NOTE — Telephone Encounter (Signed)
Rx sent, but not for twice daily. Last office visit notes that pt should be taking 22 units "ONLY ONCE" daily.

## 2020-07-15 NOTE — Telephone Encounter (Signed)
Medication Refill Request  Did you call your pharmacy and request this refill first? Yes  . If patient has not contacted pharmacy first, instruct them to do so for future refills.  . Remind them that contacting the pharmacy for their refill is the quickest method to get the refill.  . Refill policy also stated that it will take anywhere between 24-72 hours to receive the refill.    Name of medication? Lantus Solostar  Is this a 90 day supply? Yes - patient needs enough to be able to take morning and night because that's what he's been doing.  Name and location of pharmacy?   [id number] - [place], [place] - [address] Phone:  [phone]  Fax:  [phone]

## 2020-07-17 ENCOUNTER — Other Ambulatory Visit: Payer: Self-pay | Admitting: Family Medicine

## 2020-07-18 ENCOUNTER — Telehealth (INDEPENDENT_AMBULATORY_CARE_PROVIDER_SITE_OTHER): Payer: Medicare Other | Admitting: Family Medicine

## 2020-07-18 ENCOUNTER — Encounter: Payer: Self-pay | Admitting: Family Medicine

## 2020-07-18 VITALS — Ht 68.0 in | Wt 230.0 lb

## 2020-07-18 DIAGNOSIS — F1190 Opioid use, unspecified, uncomplicated: Secondary | ICD-10-CM | POA: Diagnosis not present

## 2020-07-18 DIAGNOSIS — G8929 Other chronic pain: Secondary | ICD-10-CM

## 2020-07-18 DIAGNOSIS — M25512 Pain in left shoulder: Secondary | ICD-10-CM | POA: Diagnosis not present

## 2020-07-18 DIAGNOSIS — M25511 Pain in right shoulder: Secondary | ICD-10-CM

## 2020-07-18 MED ORDER — HYDROCODONE-ACETAMINOPHEN 10-325 MG PO TABS: 1.0000 | ORAL_TABLET | Freq: Four times a day (QID) | ORAL | 0 refills | Status: AC | PRN

## 2020-07-18 NOTE — Progress Notes (Signed)
   Subjective:    Patient ID: [name], male    DOB: 1946-05-30, 74 y.o.   MRN: [id number]  HPI Virtual Visit via Telephone Note  I connected with the patient on 07/18/20 at 11:30 AM EDT by telephone and verified that I am speaking with the correct person using two identifiers.   I discussed the limitations, risks, security and privacy concerns of performing an evaluation and management service by telephone and the availability of in person appointments. I also discussed with the patient that there may be a patient responsible charge related to this service. The patient expressed understanding and agreed to proceed.  Location patient: home Location provider: work or home office Participants present for the call: patient, provider Patient did not have a visit in the prior 7 days to address this/these issue(s).   History of Present Illness: Here for pain management. He had stopped taking Norco for a few months for his shoulder pain, but lately the pain has gotten worse and he wants to start back on the medication. He recently moved to [place], [place] and no doubt moving has impacted his pain levels.  Indication for chronic opioid: shoulder pain Medication and dose: Norco 10-325 # pills per month: 120 Last UDS date: 12-10-19 Opioid Treatment Agreement signed (Y/N): 12-10-19 Opioid Treatment Agreement last reviewed with patient:  07-18-20 NCCSRS reviewed this encounter (include red flags):  07-18-20    Observations/Objective: Patient sounds cheerful and well on the phone. I do not appreciate any SOB. Speech and thought processing are grossly intact. Patient reported vitals:  Assessment and Plan: Pain management, meds were refilled. He will be here on 08-01-20 for a well exam.  Alysia Penna, MD   Follow Up Instructions:     [phone] 11-20 [phone] 21-30 I did not refer this patient for an OV in the next 24 hours for this/these issue(s).  I discussed the assessment and  treatment plan with the patient. The patient was provided an opportunity to ask questions and all were answered. The patient agreed with the plan and demonstrated an understanding of the instructions.   The patient was advised to call back or seek an in-person evaluation if the symptoms worsen or if the condition fails to improve as anticipated.  I provided 11 minutes of non-face-to-face time during this encounter.   Alysia Penna, MD    Review of Systems     Objective:   Physical Exam        Assessment & Plan:

## 2020-08-01 ENCOUNTER — Encounter: Payer: Medicare Other | Admitting: Family Medicine

## 2020-08-03 ENCOUNTER — Other Ambulatory Visit: Payer: Self-pay | Admitting: Family Medicine

## 2020-08-05 ENCOUNTER — Ambulatory Visit: Payer: Medicare Other | Admitting: Neurology

## 2020-08-12 ENCOUNTER — Telehealth: Payer: Self-pay | Admitting: Internal Medicine

## 2020-08-12 NOTE — Telephone Encounter (Signed)
Vonna Kotyk from [facility] called to check on request for Rx for patients pump supplies that was faxed on 07/29/20.   Please advise [facility] [phone]

## 2020-08-13 NOTE — Telephone Encounter (Signed)
Looks like original forms were received with wrong provider information on them.  Left message for new orders/request be faxed to the office.

## 2020-08-15 ENCOUNTER — Telehealth (INDEPENDENT_AMBULATORY_CARE_PROVIDER_SITE_OTHER): Payer: Medicare Other | Admitting: Internal Medicine

## 2020-08-15 ENCOUNTER — Telehealth: Payer: Medicare Other | Admitting: Internal Medicine

## 2020-08-15 ENCOUNTER — Encounter: Payer: Self-pay | Admitting: Internal Medicine

## 2020-08-15 ENCOUNTER — Other Ambulatory Visit: Payer: Self-pay

## 2020-08-15 DIAGNOSIS — E1140 Type 2 diabetes mellitus with diabetic neuropathy, unspecified: Secondary | ICD-10-CM | POA: Diagnosis not present

## 2020-08-15 DIAGNOSIS — E1165 Type 2 diabetes mellitus with hyperglycemia: Secondary | ICD-10-CM

## 2020-08-15 NOTE — Progress Notes (Signed)
Virtual Visit via Video Note  I connected with [name] on 10/11/21at 2:20 pm  by a video enabled telemedicine application and verified that I am speaking with the correct person using two identifiers.   I discussed the limitations of evaluation and management by telemedicine and the availability of in person appointments. The patient expressed understanding and agreed to proceed.   -Location of the patient :home -Location of the provider : Office -The names of all persons participating in the telemedicine service : Pt and myself          Name: [name]  Age/ Sex: 74 y.o., male   MRN/ DOB: [id number], Dec 01, 1945     PCP: [name]   Reason for Endocrinology Evaluation: Type 2 Diabetes Mellitus  Initial Endocrine Consultative Visit: 03/16/2020    PATIENT IDENTIFIER: [name] is a 74 y.o. male with a past medical history of T2DM, HTN and Dyslipidemia. The patient has followed with Endocrinology clinic since 03/16/2020 for consultative assistance with management of his diabetes.  DIABETIC HISTORY:  [name] was diagnosed with DM in 1995, pt has reported intolerance to Metformin. Was briefly on insulin in 2015 which we restarted in 03/2020. He has been on Glipizide  In the past without reported intolerance. His hemoglobin A1c has ranged from 7.9% in 2019, peaking at 15.0% in 2016.   SUBJECTIVE:   During the last visit (03/19/2020): A1c  10.9%   , restarted insulin and continued Steglujan   Today (08/15/2020): [name] is here for a follow up on diabetes management.  He checks his blood sugars 1 times daily, preprandial to breakfast. The patient has not had hypoglycemic episodes since the last clinic visit.     HOME DIABETES REGIMEN:  Steglujan 5-100 daily with breakfast  Lantus 22 units ONCE daily   Statin:  No  ACE-I/ARB:Yes    METER DOWNLOAD SUMMARY: unable to download Memory recall 118-208 mg/dL     DIABETIC  COMPLICATIONS: Microvascular complications:   Neuropathy  Denies: CKD, retinopathy  Last Eye Exam: Completed years ago  Macrovascular complications:   Denies: CAD, CVA, PVD   HISTORY:  Past Medical History:  Past Medical History:  Diagnosis Date  . Allergy   . BPH (benign prostatic hyperplasia)    sees [name] in [facility]   . CAD (coronary artery disease)   . Depression   . Diabetes mellitus   . Elevated PSA    sees [name] in [facility]  . GERD (gastroesophageal reflux disease)   . Hip pain, bilateral    sees Dr. Alvan Faulkner   . Hyperlipidemia   . Hypertension   . Hypogonadism male   . Insomnia     Past Surgical History:  Past Surgical History:  Procedure Laterality Date  . CHOLECYSTECTOMY    . COLONOSCOPY  12/16/2017   per Dr. Lucienne Faulkner at [place], single benign polyp, sigmoid diverticulosis, hemorrhoids   . ESOPHAGOGASTRODUODENOSCOPY  12/16/2017   per Dr. Shary Faulkner at [place], gastritis and duodenitis   . HERNIA REPAIR     umbilical  . TRANSURETHRAL RESECTION OF PROSTATE  January 2010   per Dr. Ky Faulkner     Social History:  reports that he quit smoking about 11 years ago. His smoking use included cigarettes. He has a 7.50 pack-year smoking history. He has never used smokeless tobacco. He reports that he does not drink alcohol and does not use drugs. Family History:  Family History  Problem Relation Age of  Onset  . Depression Other   . Bowel Disease Mother 20  . Heart attack Father 46  . Osteoporosis Sister   . Coronary artery disease Brother   . Diabetes Neg Hx       HOME MEDICATIONS: Allergies as of 08/15/2020      Reactions   Codeine Phosphate    REACTION: vomiting      Medication List       Accurate as of August 15, 2020  1:04 PM. If you have any questions, ask your nurse or doctor.        amitriptyline 100 MG tablet Commonly known as: ELAVIL TAKE 1 TABLET BY MOUTH EVERYDAY AT BEDTIME   Cinnamon 500 MG Tabs Take 2  tablets (1,000 mg total) by mouth 3 (three) times daily.   dicyclomine 20 MG tablet Commonly known as: Bentyl Take 1 tablet (20 mg total) by mouth 3 (three) times daily before meals.   gabapentin 300 MG capsule Commonly known as: NEURONTIN Take 1 capsule (300 mg total) by mouth 3 (three) times daily.   HYDROcodone-acetaminophen 10-325 MG tablet Commonly known as: NORCO Take 1 tablet by mouth every 6 (six) hours as needed for moderate pain.   Insulin Pen Needle 31G X 5 MM Misc 1 Device by Does not apply route as directed.   Lantus SoloStar 100 UNIT/ML Solostar Pen Generic drug: insulin glargine Inject 22 units under the skin ONLY ONCE daily.   linaclotide 145 MCG Caps capsule Commonly known as: Linzess Take 1 capsule (145 mcg total) by mouth daily before breakfast.   meloxicam 15 MG tablet Commonly known as: MOBIC TAKE 1 TABLET BY MOUTH EVERY DAY   naproxen 500 MG tablet Commonly known as: NAPROSYN TAKE 1 TABLET (500 MG TOTAL) BY MOUTH 2 (TWO) TIMES DAILY WITH A MEAL.   omeprazole 20 MG capsule Commonly known as: PRILOSEC Take 1 capsule (20 mg total) by mouth daily.   sertraline 100 MG tablet Commonly known as: ZOLOFT TAKE 1 TABLET BY MOUTH EVERY DAY   Steglujan 5-100 MG Tabs Generic drug: Ertugliflozin-SITagliptin   Steglujan 5-100 MG Tabs Generic drug: Ertugliflozin-SITagliptin TAKE 1 TABLET BY MOUTH EVERY DAY   UNKNOWN TO PATIENT Blood sugar meter used once daily   valsartan-hydrochlorothiazide 80-12.5 MG tablet Commonly known as: DIOVAN-HCT TAKE 1 TABLET BY MOUTH EVERY DAY         DATA REVIEWED:  Lab Results  Component Value Date   HGBA1C 10.9 (H) 01/19/2020   HGBA1C 7.9 (H) 09/11/2018   HGBA1C 11.2 (H) 04/08/2018   Lab Results  Component Value Date   MICROALBUR 64.3 (H) 08/31/2014   LDLCALC 169 (H) 09/04/2017   CREATININE 0.69 01/19/2020   Lab Results  Component Value Date   MICRALBCREAT 21.7 08/31/2014     Lab Results  Component  Value Date   CHOL 220 (H) 09/11/2018   HDL 43.10 09/11/2018   LDLCALC 169 (H) 09/04/2017   LDLDIRECT 152.0 09/11/2018   TRIG 309.0 (H) 09/11/2018   CHOLHDL 5 09/11/2018         ASSESSMENT / PLAN / RECOMMENDATIONS:   1) Type 2 Diabetes Mellitus, With neuropathic  complications -  - [name] has not been seen in 5 months, his ;ast A1c was 10.9 % and we started basal insulin. Unable to obtain an A1c as this is a virtual visit and he has moved to [place]. He is interested in a pump but I explained to him that a pump would require him training and since  he has relocated to [place] , it maybe best that he establishes with a local endocrinologist for proper management  - At this time , he tells me his Bg's have been less then 150 mg/dL and will continue  Current regimen.     MEDICATIONS: Continue Steglujan 5-100 daily with breakfast  Continue Lantus 22 units ONCE daily    EDUCATION / INSTRUCTIONS:  BG monitoring instructions: Patient is instructed to check his blood sugars 1 times a day,fasting   Call [facility] if: BG persistently < 70  . I reviewed the Rule of 15 for the treatment of hypoglycemia in detail with the patient. Literature supplied.       Signed electronically by: Mack Guise, Faulkner  [facility]  [facility] [address], [place], [place] [address] Phone: [phone] FAX: [phone]   CC: [name], [place] [address] Phone: [phone]  Fax: [phone]  Return to Endocrinology clinic as below: Future Appointments  Date Time Provider [place]  08/15/2020  2:20 PM Bernis Schreur, Melanie Crazier, Faulkner [facility] None

## 2020-08-23 ENCOUNTER — Other Ambulatory Visit: Payer: Self-pay | Admitting: Family Medicine

## 2020-08-23 MED ORDER — INSULIN PEN NEEDLE 31G X 5 MM MISC: 1.0000 | 3 refills | Status: DC

## 2020-08-23 NOTE — Telephone Encounter (Signed)
Insulin Pen Needle 31G X 5 MM MISC  [facility] - [place], [place] - [address] Phone:  [phone]  Fax: Direct Fax:  [phone]     [facility] would like for you to sent the Rx to the Direct Fax number [phone].  Please advise

## 2020-08-23 NOTE — Telephone Encounter (Signed)
Done

## 2020-09-16 ENCOUNTER — Other Ambulatory Visit: Payer: Self-pay | Admitting: Family Medicine

## 2020-09-16 NOTE — Telephone Encounter (Signed)
Last VV 07/18/20 Last fill 06/14/20 #90/0

## 2020-09-19 ENCOUNTER — Telehealth: Payer: Self-pay

## 2020-09-19 NOTE — Telephone Encounter (Signed)
New message    Checking on the status of fax sent over 11.10.2021 & 11.11.2021    Please advise

## 2020-09-20 NOTE — Telephone Encounter (Signed)
The faxes were already placed in [facility] inbox

## 2020-09-20 NOTE — Telephone Encounter (Addendum)
I looked at the paper in my inbox today as I was not in the office yesterday. The fax is for glucose strips which were signed and will be placed on your desk    Thanks  Abby Nena Jordan, MD  [facility]  [facility] [address] [place], [place] [address] Phone: [phone] FAX: [phone]

## 2020-09-21 NOTE — Telephone Encounter (Signed)
Form have been faxed.

## 2020-10-02 ENCOUNTER — Other Ambulatory Visit: Payer: Self-pay | Admitting: Family Medicine

## 2020-10-12 ENCOUNTER — Encounter: Payer: Self-pay | Admitting: Family Medicine

## 2020-10-12 ENCOUNTER — Telehealth (INDEPENDENT_AMBULATORY_CARE_PROVIDER_SITE_OTHER): Payer: Medicare Other | Admitting: Family Medicine

## 2020-10-12 DIAGNOSIS — M25511 Pain in right shoulder: Secondary | ICD-10-CM | POA: Diagnosis not present

## 2020-10-12 DIAGNOSIS — M25512 Pain in left shoulder: Secondary | ICD-10-CM | POA: Diagnosis not present

## 2020-10-12 DIAGNOSIS — G8929 Other chronic pain: Secondary | ICD-10-CM | POA: Diagnosis not present

## 2020-10-12 DIAGNOSIS — F1190 Opioid use, unspecified, uncomplicated: Secondary | ICD-10-CM

## 2020-10-12 MED ORDER — HYDROCODONE-ACETAMINOPHEN 10-325 MG PO TABS
1.0000 | ORAL_TABLET | Freq: Four times a day (QID) | ORAL | 0 refills | Status: AC | PRN
Start: 2020-10-12 — End: 2020-11-11

## 2020-10-12 NOTE — Progress Notes (Signed)
Subjective:    Patient ID: [name], male    DOB: 07-05-1946, 74 y.o.   MRN: [id number]  HPI Virtual Visit via Video Note  I connected with the patient on 10/12/20 at  2:15 PM EST by a video enabled telemedicine application and verified that I am speaking with the correct person using two identifiers.  Location patient: home Location provider:work or home office Persons participating in the virtual visit: patient, provider  I discussed the limitations of evaluation and management by telemedicine and the availability of in person appointments. The patient expressed understanding and agreed to proceed.   HPI: Here for pain management, he is doing well.  Indication for chronic opioid: shoulder pain Medication and dose: Norco 10-325 # pills per month: 120 Last UDS date: 12-10-19 Opioid Treatment Agreement signed (Y/N): 12-10-19 Opioid Treatment Agreement last reviewed with patient:  10-12-20 NCCSRS reviewed this encounter (include red flags): Yes    ROS: See pertinent positives and negatives per HPI.  Past Medical History:  Diagnosis Date  . Allergy   . BPH (benign prostatic hyperplasia)    sees Dr. Peterson Lombard in [facility]   . CAD (coronary artery disease)   . Depression   . Diabetes mellitus   . Elevated PSA    sees Dr. Peterson Lombard in [facility]  . GERD (gastroesophageal reflux disease)   . Hip pain, bilateral    sees Dr. Alvan Dame   . Hyperlipidemia   . Hypertension   . Hypogonadism male   . Insomnia     Past Surgical History:  Procedure Laterality Date  . CHOLECYSTECTOMY    . COLONOSCOPY  12/16/2017   per Dr. Lucienne Capers at [place], single benign polyp, sigmoid diverticulosis, hemorrhoids   . ESOPHAGOGASTRODUODENOSCOPY  12/16/2017   per Dr. Shary Key at [place], gastritis and duodenitis   . HERNIA REPAIR     umbilical  . TRANSURETHRAL RESECTION OF PROSTATE  January 2010   per Dr. Ky Barban    Family History  Problem Relation Age of Onset  . Depression Other    . Bowel Disease Mother 8  . Heart attack Father 7  . Osteoporosis Sister   . Coronary artery disease Brother   . Diabetes Neg Hx      Current Outpatient Medications:  .  HYDROcodone-acetaminophen (NORCO) 10-325 MG tablet, Take 1 tablet by mouth every 6 (six) hours as needed for moderate pain., Disp: 120 tablet, Rfl: 0 .  insulin glargine (LANTUS SOLOSTAR) 100 UNIT/ML Solostar Pen, Inject 22 units under the skin ONLY ONCE daily., Disp: 15 mL, Rfl: 0 .  Insulin Pen Needle 31G X 5 MM MISC, 1 Device by Does not apply route as directed., Disp: 150 each, Rfl: 3 .  linaclotide (LINZESS) 145 MCG CAPS capsule, Take 1 capsule (145 mcg total) by mouth daily before breakfast., Disp: 30 capsule, Rfl: 0 .  meloxicam (MOBIC) 15 MG tablet, TAKE 1 TABLET BY MOUTH EVERY DAY, Disp: 30 tablet, Rfl: 11 .  naproxen (NAPROSYN) 500 MG tablet, TAKE 1 TABLET (500 MG TOTAL) BY MOUTH 2 (TWO) TIMES DAILY WITH A MEAL., Disp: 60 tablet, Rfl: 11 .  omeprazole (PRILOSEC) 20 MG capsule, Take 1 capsule (20 mg total) by mouth daily., Disp: 30 capsule, Rfl: 11 .  sertraline (ZOLOFT) 100 MG tablet, TAKE 1 TABLET BY MOUTH EVERY DAY, Disp: 90 tablet, Rfl: 2 .  STEGLUJAN 5-100 MG TABS, TAKE 1 TABLET BY MOUTH EVERY DAY, Disp: 30 tablet, Rfl: 2 .  STEGLUJAN 5-100 MG TABS,  TAKE 1 TABLET BY MOUTH EVERY DAY, Disp: 30 tablet, Rfl: 1 .  UNKNOWN TO PATIENT, Blood sugar meter used once daily, Disp: , Rfl:  .  valsartan-hydrochlorothiazide (DIOVAN-HCT) 80-12.5 MG tablet, TAKE 1 TABLET BY MOUTH EVERY DAY, Disp: 90 tablet, Rfl: 0  EXAM:  VITALS per patient if applicable:  GENERAL: alert, oriented, appears well and in no acute distress  HEENT: atraumatic, conjunttiva clear, no obvious abnormalities on inspection of external nose and ears  NECK: normal movements of the head and neck  LUNGS: on inspection no signs of respiratory distress, breathing rate appears normal, no obvious gross SOB, gasping or wheezing  CV: no obvious  cyanosis  MS: moves all visible extremities without noticeable abnormality  PSYCH/NEURO: pleasant and cooperative, no obvious depression or anxiety, speech and thought processing grossly intact  ASSESSMENT AND PLAN: Pain management, meds were refilled.  Alysia Penna, MD  Discussed the following assessment and plan:  No diagnosis found.     I discussed the assessment and treatment plan with the patient. The patient was provided an opportunity to ask questions and all were answered. The patient agreed with the plan and demonstrated an understanding of the instructions.   The patient was advised to call back or seek an in-person evaluation if the symptoms worsen or if the condition fails to improve as anticipated.     Review of Systems     Objective:   Physical Exam        Assessment & Plan:

## 2020-11-02 ENCOUNTER — Telehealth: Payer: Medicare Other | Admitting: Neurology

## 2020-12-04 ENCOUNTER — Other Ambulatory Visit: Payer: Self-pay | Admitting: Family Medicine

## 2020-12-22 ENCOUNTER — Telehealth: Payer: Self-pay | Admitting: Internal Medicine

## 2020-12-22 NOTE — Telephone Encounter (Signed)
Spoken to patient and inform him that I have not seen an order form for [facility].Not sure what happen. Patient will check with [facility].

## 2020-12-22 NOTE — Telephone Encounter (Signed)
Pt called because he has been trying to get [facility] to complete a fax for his needles since November. Pt says [facility] sent  fax yesterday and he will be calling them again today to send over another fax. He asks for it to please be completed and sent back to them as soon as possible.

## 2020-12-23 ENCOUNTER — Ambulatory Visit (INDEPENDENT_AMBULATORY_CARE_PROVIDER_SITE_OTHER): Payer: Medicare Other

## 2020-12-23 ENCOUNTER — Other Ambulatory Visit: Payer: Self-pay | Admitting: Family Medicine

## 2020-12-23 DIAGNOSIS — Z0000 Encounter for general adult medical examination without abnormal findings: Secondary | ICD-10-CM

## 2020-12-23 NOTE — Patient Instructions (Signed)
[name] , Thank you for taking time to come for your Medicare Wellness Visit. I appreciate your ongoing commitment to your health goals. Please review the following plan we discussed and let me know if I can assist you in the future.   Screening recommendations/referrals: Colonoscopy: Currently due, orders placed this visit  Recommended yearly ophthalmology/optometry visit for glaucoma screening and checkup Recommended yearly dental visit for hygiene and checkup  Vaccinations: Influenza vaccine: Patient declined  Pneumococcal vaccine: Currently due, if you would like to receive we can give it you at your next in person office visit or you may receive at you local pharmacy. Tdap vaccine: Currently due for TDAP. You may receive at your local pharmacy. Shingles vaccine: Currently due , we recommend that you receive at your local pharmacy as it is less expensive.     Advanced directives: Advance directive discussed with you today. Even though you declined this today please call our office should you change your mind and we can give you the proper paperwork for you to fill out.   Conditions/risks identified: None   Next appointment: None   Preventive Care 65 Years and Older, Male Preventive care refers to lifestyle choices and visits with your health care provider that can promote health and wellness. What does preventive care include?  A yearly physical exam. This is also called an annual well check.  Dental exams once or twice a year.  Routine eye exams. Ask your health care provider how often you should have your eyes checked.  Personal lifestyle choices, including:  Daily care of your teeth and gums.  Regular physical activity.  Eating a healthy diet.  Avoiding tobacco and drug use.  Limiting alcohol use.  Practicing safe sex.  Taking low doses of aspirin every day.  Taking vitamin and mineral supplements as recommended by your health care provider. What happens  during an annual well check? The services and screenings done by your health care provider during your annual well check will depend on your age, overall health, lifestyle risk factors, and family history of disease. Counseling  Your health care provider may ask you questions about your:  Alcohol use.  Tobacco use.  Drug use.  Emotional well-being.  Home and relationship well-being.  Sexual activity.  Eating habits.  History of falls.  Memory and ability to understand (cognition).  Work and work Statistician. Screening  You may have the following tests or measurements:  Height, weight, and BMI.  Blood pressure.  Lipid and cholesterol levels. These may be checked every 5 years, or more frequently if you are over 65 years old.  Skin check.  Lung cancer screening. You may have this screening every year starting at age 60 if you have a 30-pack-year history of smoking and currently smoke or have quit within the past 15 years.  Fecal occult blood test (FOBT) of the stool. You may have this test every year starting at age 43.  Flexible sigmoidoscopy or colonoscopy. You may have a sigmoidoscopy every 5 years or a colonoscopy every 10 years starting at age 45.  Prostate cancer screening. Recommendations will vary depending on your family history and other risks.  Hepatitis C blood test.  Hepatitis B blood test.  Sexually transmitted disease (STD) testing.  Diabetes screening. This is done by checking your blood sugar (glucose) after you have not eaten for a while (fasting). You may have this done every 1-3 years.  Abdominal aortic aneurysm (AAA) screening. You may need this if you  are a current or former smoker.  Osteoporosis. You may be screened starting at age 52 if you are at high risk. Talk with your health care provider about your test results, treatment options, and if necessary, the need for more tests. Vaccines  Your health care provider may recommend certain  vaccines, such as:  Influenza vaccine. This is recommended every year.  Tetanus, diphtheria, and acellular pertussis (Tdap, Td) vaccine. You may need a Td booster every 10 years.  Zoster vaccine. You may need this after age 24.  Pneumococcal 13-valent conjugate (PCV13) vaccine. One dose is recommended after age 96.  Pneumococcal polysaccharide (PPSV23) vaccine. One dose is recommended after age 36. Talk to your health care provider about which screenings and vaccines you need and how often you need them. This information is not intended to replace advice given to you by your health care provider. Make sure you discuss any questions you have with your health care provider. Document Released: 11/18/2015 Document Revised: 07/11/2016 Document Reviewed: 08/23/2015 Elsevier Interactive Patient Education  2017 [employer] Prevention in the Home Falls can cause injuries. They can happen to people of all ages. There are many things you can do to make your home safe and to help prevent falls. What can I do on the outside of my home?  Regularly fix the edges of walkways and driveways and fix any cracks.  Remove anything that might make you trip as you walk through a door, such as a raised step or threshold.  Trim any bushes or trees on the path to your home.  Use bright outdoor lighting.  Clear any walking paths of anything that might make someone trip, such as rocks or tools.  Regularly check to see if handrails are loose or broken. Make sure that both sides of any steps have handrails.  Any raised decks and porches should have guardrails on the edges.  Have any leaves, snow, or ice cleared regularly.  Use sand or salt on walking paths during winter.  Clean up any spills in your garage right away. This includes oil or grease spills. What can I do in the bathroom?  Use night lights.  Install grab bars by the toilet and in the tub and shower. Do not use towel bars as grab  bars.  Use non-skid mats or decals in the tub or shower.  If you need to sit down in the shower, use a plastic, non-slip stool.  Keep the floor dry. Clean up any water that spills on the floor as soon as it happens.  Remove soap buildup in the tub or shower regularly.  Attach bath mats securely with double-sided non-slip rug tape.  Do not have throw rugs and other things on the floor that can make you trip. What can I do in the bedroom?  Use night lights.  Make sure that you have a light by your bed that is easy to reach.  Do not use any sheets or blankets that are too big for your bed. They should not hang down onto the floor.  Have a firm chair that has side arms. You can use this for support while you get dressed.  Do not have throw rugs and other things on the floor that can make you trip. What can I do in the kitchen?  Clean up any spills right away.  Avoid walking on wet floors.  Keep items that you use a lot in easy-to-reach places.  If you need to reach  something above you, use a strong step stool that has a grab bar.  Keep electrical cords out of the way.  Do not use floor polish or wax that makes floors slippery. If you must use wax, use non-skid floor wax.  Do not have throw rugs and other things on the floor that can make you trip. What can I do with my stairs?  Do not leave any items on the stairs.  Make sure that there are handrails on both sides of the stairs and use them. Fix handrails that are broken or loose. Make sure that handrails are as long as the stairways.  Check any carpeting to make sure that it is firmly attached to the stairs. Fix any carpet that is loose or worn.  Avoid having throw rugs at the top or bottom of the stairs. If you do have throw rugs, attach them to the floor with carpet tape.  Make sure that you have a light switch at the top of the stairs and the bottom of the stairs. If you do not have them, ask someone to add them for  you. What else can I do to help prevent falls?  Wear shoes that:  Do not have high heels.  Have rubber bottoms.  Are comfortable and fit you well.  Are closed at the toe. Do not wear sandals.  If you use a stepladder:  Make sure that it is fully opened. Do not climb a closed stepladder.  Make sure that both sides of the stepladder are locked into place.  Ask someone to hold it for you, if possible.  Clearly mark and make sure that you can see:  Any grab bars or handrails.  First and last steps.  Where the edge of each step is.  Use tools that help you move around (mobility aids) if they are needed. These include:  Canes.  Walkers.  Scooters.  Crutches.  Turn on the lights when you go into a dark area. Replace any light bulbs as soon as they burn out.  Set up your furniture so you have a clear path. Avoid moving your furniture around.  If any of your floors are uneven, fix them.  If there are any pets around you, be aware of where they are.  Review your medicines with your doctor. Some medicines can make you feel dizzy. This can increase your chance of falling. Ask your doctor what other things that you can do to help prevent falls. This information is not intended to replace advice given to you by your health care provider. Make sure you discuss any questions you have with your health care provider. Document Released: 08/18/2009 Document Revised: 03/29/2016 Document Reviewed: 11/26/2014 Elsevier Interactive Patient Education  2017 [employer].

## 2020-12-23 NOTE — Progress Notes (Addendum)
Subjective:   Grayton Lobo is a 75 y.o. male who presents for an Initial Medicare Annual Wellness Visit.  Virtual Visit via Video Note  I connected with Harlon Ditty by a video enabled telemedicine application and verified that I am speaking with the correct person using two identifiers.  Location: Patient: Home Provider: Office Persons participating in the virtual visit: patient, provider   I discussed the limitations of evaluation and management by telemedicine and the availability of in person appointments. The patient expressed understanding and agreed to proceed.     Larene Beach Crews,LPN    Review of Systems    N/A  Cardiac Risk Factors include: advanced age (>79men, >79 women);diabetes mellitus;male gender;hypertension;obesity (BMI >30kg/m2)     Objective:    Today's Vitals   12/23/20 0948  PainSc: 3    There is no height or weight on file to calculate BMI.  Advanced Directives 12/23/2020 03/18/2020 08/06/2019  Does Patient Have a Medical Advance Directive? No No No  Would patient like information on creating a medical advance directive? No - Patient declined - -    Current Medications (verified) Outpatient Encounter Medications as of 12/23/2020  Medication Sig  . insulin glargine (LANTUS SOLOSTAR) 100 UNIT/ML Solostar Pen Inject 22 units under the skin ONLY ONCE daily.  Marland Kitchen linaclotide (LINZESS) 145 MCG CAPS capsule Take 1 capsule (145 mcg total) by mouth daily before breakfast.  . omeprazole (PRILOSEC) 20 MG capsule Take 1 capsule (20 mg total) by mouth daily.  . sertraline (ZOLOFT) 100 MG tablet TAKE 1 TABLET BY MOUTH EVERY DAY  . STEGLUJAN 5-100 MG TABS TAKE 1 TABLET BY MOUTH EVERY DAY  . UNKNOWN TO PATIENT Blood sugar meter used once daily  . valsartan-hydrochlorothiazide (DIOVAN-HCT) 80-12.5 MG tablet TAKE 1 TABLET BY MOUTH EVERY DAY  . [DISCONTINUED] Insulin Pen Needle 31G X 5 MM MISC 1 Device by Does not apply route as directed.  . [DISCONTINUED]  lisinopril-hydrochlorothiazide (PRINZIDE,ZESTORETIC) 20-25 MG per tablet Take 1 tablet by mouth daily.  . [DISCONTINUED] meloxicam (MOBIC) 15 MG tablet TAKE 1 TABLET BY MOUTH EVERY DAY (Patient not taking: Reported on 12/23/2020)  . [DISCONTINUED] naproxen (NAPROSYN) 500 MG tablet TAKE 1 TABLET (500 MG TOTAL) BY MOUTH 2 (TWO) TIMES DAILY WITH A MEAL.  . [DISCONTINUED] STEGLUJAN 5-100 MG TABS TAKE 1 TABLET BY MOUTH EVERY DAY   No facility-administered encounter medications on file as of 12/23/2020.    Allergies (verified) Codeine phosphate   History: Past Medical History:  Diagnosis Date  . Allergy   . BPH (benign prostatic hyperplasia)    sees Dr. Peterson Lombard in [facility]   . CAD (coronary artery disease)   . Depression   . Diabetes mellitus   . Elevated PSA    sees Dr. Peterson Lombard in [facility]  . GERD (gastroesophageal reflux disease)   . Hip pain, bilateral    sees Dr. Alvan Dame   . Hyperlipidemia   . Hypertension   . Hypogonadism male   . Insomnia    Past Surgical History:  Procedure Laterality Date  . CHOLECYSTECTOMY    . COLONOSCOPY  12/16/2017   per Dr. Lucienne Capers at [place], single benign polyp, sigmoid diverticulosis, hemorrhoids   . ESOPHAGOGASTRODUODENOSCOPY  12/16/2017   per Dr. Shary Key at [place], gastritis and duodenitis   . HERNIA REPAIR     umbilical  . TRANSURETHRAL RESECTION OF PROSTATE  January 2010   per Dr. Ky Barban   Family History  Problem Relation Age of Onset  .  Depression Other   . Bowel Disease Mother 22  . Heart attack Father 32  . Osteoporosis Sister   . Coronary artery disease Brother   . Diabetes Neg Hx    Social History   Socioeconomic History  . Marital status: Married    Spouse name: Shirlee Limerick  . Number of children: 4  . Years of education: Not on file  . Highest education level: Associate degree: occupational, Hotel manager, or vocational program  Occupational History  . Occupation: retired  Tobacco Use  . Smoking status: Former Smoker     Packs/day: 0.50    Years: 15.00    Pack years: 7.50    Types: Cigarettes    Quit date: 2010    Years since quitting: 12.1  . Smokeless tobacco: Never Used  Vaping Use  . Vaping Use: Never used  Substance and Sexual Activity  . Alcohol use: No    Alcohol/week: 0.0 standard drinks  . Drug use: No  . Sexual activity: Not on file  Other Topics Concern  . Not on file  Social History Narrative   Patient is right-handed. He lives with his wife in a 2 story house. He drinks 2 cups of coffee a day, and an occasionally soda. He does not exercise.   Social Determinants of Health   Financial Resource Strain: Low Risk   . Difficulty of Paying Living Expenses: Not hard at all  Food Insecurity: No Food Insecurity  . Worried About Charity fundraiser in the Last Year: Never true  . Ran Out of Food in the Last Year: Never true  Transportation Needs: No Transportation Needs  . Lack of Transportation (Medical): No  . Lack of Transportation (Non-Medical): No  Physical Activity: Inactive  . Days of Exercise per Week: 0 days  . Minutes of Exercise per Session: 0 min  Stress: No Stress Concern Present  . Feeling of Stress : Not at all  Social Connections: Moderately Isolated  . Frequency of Communication with Friends and Family: More than three times a week  . Frequency of Social Gatherings with Friends and Family: Twice a week  . Attends Religious Services: Never  . Active Member of Clubs or Organizations: No  . Attends Archivist Meetings: Never  . Marital Status: Married    Tobacco Counseling Counseling given: Not Answered   Clinical Intake:  Pre-visit preparation completed: Yes  Pain : 0-10 Pain Score: 3  Pain Type: Acute pain Pain Location: Rib cage Pain Orientation: Right,Left Pain Descriptors / Indicators: Aching Pain Onset: In the past 7 days Pain Frequency: Intermittent Pain Relieving Factors: Aleve  Pain Relieving Factors: Aleve  Nutritional Risks:  None Diabetes: Yes CBG done?: No Did pt. bring in CBG monitor from home?: No  How often do you need to have someone help you when you read instructions, pamphlets, or other written materials from your doctor or pharmacy?: 1 - Never  Diabetic?Yes Nutrition Risk Assessment:  Has the patient had any N/V/D within the last 2 months?  No  Does the patient have any non-healing wounds?  No  Has the patient had any unintentional weight loss or weight gain?  No   Diabetes:  Is the patient diabetic?  Yes  If diabetic, was a CBG obtained today?  No  Did the patient bring in their glucometer from home?  No  How often do you monitor your CBG's? Checks glucose every morning .   Financial Strains and Diabetes Management:  Are you having any  financial strains with the device, your supplies or your medication? No .  Does the patient want to be seen by Chronic Care Management for management of their diabetes?  No  Would the patient like to be referred to a Nutritionist or for Diabetic Management?  No   Diabetic Exams:  Diabetic Eye Exam: Overdue for diabetic eye exam. Pt has been advised about the importance in completing this exam. Patient advised to call and schedule an eye exam. Diabetic Foot Exam: Overdue, Pt has been advised about the importance in completing this exam. Pt is scheduled for diabetic foot exam on next scheduled appointment with Endocrinologist.   Interpreter Needed?: No  Information entered by :: [place] of Daily Living In your present state of health, do you have any difficulty performing the following activities: 12/23/2020  Hearing? Y  Comment hearing is down 35%. Has tried hearing aids none have worked. Audiologist has recommended implants  Vision? N  Difficulty concentrating or making decisions? N  Walking or climbing stairs? N  Dressing or bathing? N  Doing errands, shopping? N  Preparing Food and eating ? N  Using the Toilet? N  In the past six  months, have you accidently leaked urine? Y  Comment has occassional bladder leakage  Do you have problems with loss of bowel control? N  Managing your Medications? N  Managing your Finances? N  Housekeeping or managing your Housekeeping? N  Some recent data might be hidden    Patient Care Team: Laurey Morale, MD as PCP - Loman Brooklyn, Stephan Minister, DO as Consulting Physician (Neurology)  Indicate any recent Medical Services you may have received from other than [facility] providers in the past year (date may be approximate).     Assessment:   This is a routine wellness examination for [place].  Hearing/Vision screen  Hearing Screening   125Hz  250Hz  500Hz  1000Hz  2000Hz  3000Hz  4000Hz  6000Hz  8000Hz   Right ear:           Left ear:           Vision Screening Comments: Patient states gets eyes examined once per year. Wears glasses    Dietary issues and exercise activities discussed: Current Exercise Habits: The patient does not participate in regular exercise at present, Exercise limited by: None identified  Goals    . Exercise 3x per week (30 min per time)    . Patient Stated     I would like to start working in my wood shop      Depression Screen PHQ 2/9 Scores 12/23/2020 03/18/2020 07/22/2018 05/05/2015  PHQ - 2 Score 0 1 0 0    Fall Risk Fall Risk  12/23/2020 03/18/2020 08/06/2019 09/19/2018 05/27/2017  Falls in the past year? 1 1 1 1  Yes  Comment - - - - Emmi Telephone Survey: data to providers prior to load  Number falls in past yr: 1 1 1 1 2  or more  Comment - - - - Emmi Telephone Survey Actual Response = 2  Injury with Fall? 0 0 1 0 Yes  Risk for fall due to : History of fall(s);Impaired balance/gait - - - -  Follow up Falls evaluation completed;Falls prevention discussed - - Falls evaluation completed -    FALL RISK PREVENTION PERTAINING TO THE HOME:  Any stairs in or around the home? Yes  If so, are there any without handrails? No  Home free of loose throw rugs in walkways,  pet beds, electrical cords, etc? Yes  Adequate lighting in your home to reduce risk of falls? Yes   ASSISTIVE DEVICES UTILIZED TO PREVENT FALLS:  Life alert? No  Use of a cane, walker or w/c? No  Grab bars in the bathroom? Yes  Shower chair or bench in shower? No  Elevated toilet seat or a handicapped toilet? No    Cognitive Function:        Immunizations Immunization History  Administered Date(s) Administered  . Influenza, High Dose Seasonal PF 09/03/2016  . Influenza,inj,Quad PF,6+ Mos 08/26/2013, 09/06/2014, 09/02/2015    TDAP status: Due, Education has been provided regarding the importance of this vaccine. Advised may receive this vaccine at local pharmacy or Health Dept. Aware to provide a copy of the vaccination record if obtained from local pharmacy or Health Dept. Verbalized acceptance and understanding.  Flu Vaccine status: Due, Education has been provided regarding the importance of this vaccine. Advised may receive this vaccine at local pharmacy or Health Dept. Aware to provide a copy of the vaccination record if obtained from local pharmacy or Health Dept. Verbalized acceptance and understanding.  Pneumococcal vaccine status: Due, Education has been provided regarding the importance of this vaccine. Advised may receive this vaccine at local pharmacy or Health Dept. Aware to provide a copy of the vaccination record if obtained from local pharmacy or Health Dept. Verbalized acceptance and understanding.  Covid-19 vaccine status: Completed vaccines  Qualifies for Shingles Vaccine? Yes   Zostavax completed No   Shingrix Completed?: No.    Education has been provided regarding the importance of this vaccine. Patient has been advised to call insurance company to determine out of pocket expense if they have not yet received this vaccine. Advised may also receive vaccine at local pharmacy or Health Dept. Verbalized acceptance and understanding.  Screening Tests Health  Maintenance  Topic Date Due  . Hepatitis C Screening  Never done  . COVID-19 Vaccine (1) Never done  . OPHTHALMOLOGY EXAM  Never done  . PNA vac Low Risk Adult (1 of 2 - PCV13) Never done  . FOOT EXAM  09/07/2015  . COLONOSCOPY (Pts 45-47yrs Insurance coverage will need to be confirmed)  09/14/2018  . HEMOGLOBIN A1C  07/21/2020  . TETANUS/TDAP  06/07/2030  . INFLUENZA VACCINE  Discontinued    Health Maintenance  Health Maintenance Due  Topic Date Due  . Hepatitis C Screening  Never done  . COVID-19 Vaccine (1) Never done  . OPHTHALMOLOGY EXAM  Never done  . PNA vac Low Risk Adult (1 of 2 - PCV13) Never done  . FOOT EXAM  09/07/2015  . COLONOSCOPY (Pts 45-79yrs Insurance coverage will need to be confirmed)  09/14/2018  . HEMOGLOBIN A1C  07/21/2020    Colorectal cancer screening: Referral to GI placed 12/23/2020. Pt aware the office will call re: appt.  Lung Cancer Screening: (Low Dose CT Chest recommended if Age 54-80 years, 30 pack-year currently smoking OR have quit w/in 15years.) does not qualify.   Lung Cancer Screening Referral: N/A   Additional Screening:  Hepatitis C Screening: does qualify;   Vision Screening: Recommended annual ophthalmology exams for early detection of glaucoma and other disorders of the eye. Is the patient up to date with their annual eye exam?  Yes  Who is the provider or what is the name of the office in which the patient attends annual eye exams? Unsure of eye doctors name.  If pt is not established with a provider, would they like to be referred to a provider  to establish care? No .   Dental Screening: Recommended annual dental exams for proper oral hygiene  Community Resource Referral / Chronic Care Management: CRR required this visit?  No   CCM required this visit?  No      Plan:     I have personally reviewed and noted the following in the patient's chart:   . Medical and social history . Use of alcohol, tobacco or illicit  drugs  . Current medications and supplements . Functional ability and status . Nutritional status . Physical activity . Advanced directives . List of other physicians . Hospitalizations, surgeries, and ER visits in previous 12 months . Vitals . Screenings to include cognitive, depression, and falls . Referrals and appointments  In addition, I have reviewed and discussed with patient certain preventive protocols, quality metrics, and best practice recommendations. A written personalized care plan for preventive services as well as general preventive health recommendations were provided to patient.     Ofilia Neas, LPN   [id number]   Nurse Notes: None

## 2020-12-26 NOTE — Telephone Encounter (Signed)
Spoken to Dr Kelton Pillar. If patient called again, he will need an appointment for [place] before we do anything else. He is overdue for the recommended follow up.

## 2021-01-02 NOTE — Telephone Encounter (Signed)
Pt called asking to get the status of his PA for his needles. Pt has an appt on 3/23.  Pt requests a call when this has been done [phone]. Please leave a message if he doesn't pick up.

## 2021-01-05 NOTE — Telephone Encounter (Signed)
I have called patient and he was able to give another number to [place] [facility] at [phone]. I did call and rep told me they will have the RX fax to our office, they confirm.

## 2021-01-06 ENCOUNTER — Telehealth: Payer: Self-pay | Admitting: Internal Medicine

## 2021-01-06 NOTE — Telephone Encounter (Signed)
PLease cancel his visit with me. He moved to [place] and I have told him in October , 2021 that he will need to establish with an endocrinologist in [place] .   I will refill his pen needles    I have not seen him since 03/2020 and virtual visit is not a good way to provide him with diabetes care, he is VERY hard of hearing and we didn't  have his meter last visit and I was not able to help with anything .     Abby Nena Jordan, MD  [facility]  [facility] [address] [place], [place] [address] Phone: [phone] FAX: [phone]

## 2021-01-09 NOTE — Telephone Encounter (Signed)
Message left for patient to return my call.  

## 2021-01-11 ENCOUNTER — Encounter: Payer: Self-pay | Admitting: Internal Medicine

## 2021-01-11 NOTE — Telephone Encounter (Signed)
Spoke with pt letting him know his paperwork was faxed.   Relayed Dr.Shamleffer's message to pt, he is going to be meeting with an endocrinologist in [place] today and has a list of others he is going to be checking out now that things have died down. Pt has cancelled his upcoming appointment.   Pt wanted to extend his gratitude and thanks to [facility] for all she has done for him. Patient says she has been wonderful!

## 2021-01-11 NOTE — Telephone Encounter (Signed)
Noted. I will let Dr Kelton Pillar know

## 2021-01-25 ENCOUNTER — Encounter: Payer: Self-pay | Admitting: Family Medicine

## 2021-01-25 ENCOUNTER — Telehealth (INDEPENDENT_AMBULATORY_CARE_PROVIDER_SITE_OTHER): Payer: Medicare Other | Admitting: Family Medicine

## 2021-01-25 ENCOUNTER — Telehealth: Payer: Medicare Other | Admitting: Internal Medicine

## 2021-01-25 DIAGNOSIS — F418 Other specified anxiety disorders: Secondary | ICD-10-CM | POA: Diagnosis not present

## 2021-01-25 MED ORDER — SERTRALINE HCL 100 MG PO TABS: 150.0000 mg | ORAL_TABLET | Freq: Every day | ORAL | 2 refills | Status: AC

## 2021-01-25 NOTE — Progress Notes (Signed)
Subjective:    Patient ID: [name], male    DOB: 06-24-1946, 75 y.o.   MRN: [id number]  HPI Virtual Visit via Video Note  I connected with the patient on 01/25/21 at  2:00 PM EDT by a video enabled telemedicine application and verified that I am speaking with the correct person using two identifiers.  Location patient: home Location provider:work or home office Persons participating in the virtual visit: patient, provider  I discussed the limitations of evaluation and management by telemedicine and the availability of in person appointments. The patient expressed understanding and agreed to proceed.   HPI: Here to follow up on depression with anxiety. He has been taking 100 mg a day of Sertraline for about 6 months. This worked well for a time, but over the past 3 weeks he has felt a little more depressed than usual. He is not sure why. Things are going well, he is settled into his new home in [place].    ROS: See pertinent positives and negatives per HPI.  Past Medical History:  Diagnosis Date  . Allergy   . BPH (benign prostatic hyperplasia)    sees Dr. Peterson Lombard in [facility]   . CAD (coronary artery disease)   . Depression   . Diabetes mellitus   . Elevated PSA    sees Dr. Peterson Lombard in [facility]  . GERD (gastroesophageal reflux disease)   . Hip pain, bilateral    sees Dr. Alvan Dame   . Hyperlipidemia   . Hypertension   . Hypogonadism male   . Insomnia     Past Surgical History:  Procedure Laterality Date  . CHOLECYSTECTOMY    . COLONOSCOPY  12/16/2017   per Dr. Lucienne Capers at [place], single benign polyp, sigmoid diverticulosis, hemorrhoids   . ESOPHAGOGASTRODUODENOSCOPY  12/16/2017   per Dr. Shary Key at [place], gastritis and duodenitis   . HERNIA REPAIR     umbilical  . TRANSURETHRAL RESECTION OF PROSTATE  January 2010   per Dr. Ky Barban    Family History  Problem Relation Age of Onset  . Depression Other   . Bowel Disease Mother 65  . Heart  attack Father 62  . Osteoporosis Sister   . Coronary artery disease Brother   . Diabetes Neg Hx      Current Outpatient Medications:  .  insulin glargine (LANTUS SOLOSTAR) 100 UNIT/ML Solostar Pen, Inject 22 units under the skin ONLY ONCE daily., Disp: 15 mL, Rfl: 0 .  linaclotide (LINZESS) 145 MCG CAPS capsule, Take 1 capsule (145 mcg total) by mouth daily before breakfast. (Patient taking differently: Take 145 mcg by mouth as needed.), Disp: 30 capsule, Rfl: 0 .  omeprazole (PRILOSEC) 20 MG capsule, Take 1 capsule (20 mg total) by mouth daily., Disp: 30 capsule, Rfl: 11 .  sertraline (ZOLOFT) 100 MG tablet, TAKE 1 TABLET BY MOUTH EVERY DAY, Disp: 90 tablet, Rfl: 2 .  STEGLUJAN 5-100 MG TABS, TAKE 1 TABLET BY MOUTH EVERY DAY, Disp: 30 tablet, Rfl: 1 .  UNKNOWN TO PATIENT, Blood sugar meter used once daily, Disp: , Rfl:  .  valsartan-hydrochlorothiazide (DIOVAN-HCT) 80-12.5 MG tablet, TAKE 1 TABLET BY MOUTH EVERY DAY (Patient not taking: Reported on 01/25/2021), Disp: 90 tablet, Rfl: 0  EXAM:  VITALS per patient if applicable:  GENERAL: alert, oriented, appears well and in no acute distress  HEENT: atraumatic, conjunttiva clear, no obvious abnormalities on inspection of external nose and ears  NECK: normal movements of the head and neck  LUNGS: on inspection no signs of respiratory distress, breathing rate appears normal, no obvious gross SOB, gasping or wheezing  CV: no obvious cyanosis  MS: moves all visible extremities without noticeable abnormality  PSYCH/NEURO: pleasant and cooperative, no obvious depression or anxiety, speech and thought processing grossly intact  ASSESSMENT AND PLAN: Depression with anxiety. We will increase the Sertraline to 150 mg daily. He will report back in one month. Alysia Penna, MD  Discussed the following assessment and plan:  Depression with anxiety     I discussed the assessment and treatment plan with the patient. The patient was provided  an opportunity to ask questions and all were answered. The patient agreed with the plan and demonstrated an understanding of the instructions.   The patient was advised to call back or seek an in-person evaluation if the symptoms worsen or if the condition fails to improve as anticipated.     Review of Systems     Objective:   Physical Exam        Assessment & Plan:

## 2021-02-03 ENCOUNTER — Other Ambulatory Visit: Payer: Self-pay | Admitting: Family Medicine

## 2021-04-27 ENCOUNTER — Encounter: Payer: Self-pay | Admitting: Family Medicine

## 2021-04-28 MED ORDER — TADALAFIL 5 MG PO TABS: 5.0000 mg | ORAL_TABLET | Freq: Every day | ORAL | 11 refills | Status: DC

## 2021-04-28 NOTE — Telephone Encounter (Signed)
I understand. I sent in refills for Cialis for one year

## 2021-05-15 MED ORDER — TADALAFIL 20 MG PO TABS: 10.0000 mg | ORAL_TABLET | Freq: Every day | ORAL | 5 refills | Status: AC | PRN

## 2021-05-15 NOTE — Telephone Encounter (Signed)
I sent in the 20 mg Cialis

## 2021-07-10 ENCOUNTER — Other Ambulatory Visit: Payer: Self-pay | Admitting: Family Medicine

## 2021-12-19 ENCOUNTER — Telehealth: Payer: Self-pay | Admitting: Family Medicine

## 2021-12-19 NOTE — Telephone Encounter (Signed)
Left message for patient to call back and schedule Medicare Annual Wellness Visit (AWV) either virtually or in office. Left  my [name] number [phone]   Last AWV  12/23/20  please schedule at anytime with [facility] Nurse Health Advisor 1 or 2   This should be a 45 minute visit.

## 2022-12-20 ENCOUNTER — Telehealth: Payer: Self-pay

## 2022-12-20 NOTE — Transitions of Care (Post Inpatient/ED Visit) (Signed)
   12/20/2022  Name: [name] MRN: [id number] DOB: 1946-03-21  Today's TOC FU Call Status:    Attempted to reach the patient regarding the most recent Inpatient/ED visit.  Follow Up Plan: Additional outreach attempts will be made to reach the patient to complete the Transitions of Care (Post Inpatient/ED visit) call.   [place] LPN [place] Advisor Direct Dial [phone]

## 2022-12-24 NOTE — Transitions of Care (Post Inpatient/ED Visit) (Signed)
   12/24/2022  Name: [name] MRN: [id number] DOB: 01-27-1946  Today's TOC FU Call Status: Today's TOC FU Call Status:: Unsuccessful Call (2nd Attempt) Unsuccessful Call (2nd Attempt) Date: 12/24/22  Attempted to reach the patient regarding the most recent Inpatient/ED visit.  Follow Up Plan: No further outreach attempts will be made at this time. We have been unable to contact the patient.  [place] LPN [place] Advisor Direct Dial [phone]
